# Patient Record
Sex: Male | Born: 1965 | Race: Black or African American | Marital: Married | Smoking: Former smoker
Health system: Southern US, Community
[De-identification: ages and names within clinical notes are randomized; demographics above are authoritative.]

## PROBLEM LIST (undated history)

## (undated) DIAGNOSIS — E785 Hyperlipidemia, unspecified: Secondary | ICD-10-CM

## (undated) DIAGNOSIS — F4310 Post-traumatic stress disorder, unspecified: Secondary | ICD-10-CM

## (undated) DIAGNOSIS — R011 Cardiac murmur, unspecified: Secondary | ICD-10-CM

## (undated) DIAGNOSIS — T7840XA Allergy, unspecified, initial encounter: Secondary | ICD-10-CM

## (undated) DIAGNOSIS — M549 Dorsalgia, unspecified: Secondary | ICD-10-CM

## (undated) DIAGNOSIS — M1990 Unspecified osteoarthritis, unspecified site: Secondary | ICD-10-CM

## (undated) DIAGNOSIS — M4800 Spinal stenosis, site unspecified: Secondary | ICD-10-CM

## (undated) HISTORY — DX: Allergy, unspecified, initial encounter: T78.40XA

## (undated) HISTORY — DX: Hyperlipidemia, unspecified: E78.5

## (undated) HISTORY — DX: Unspecified osteoarthritis, unspecified site: M19.90

## (undated) HISTORY — PX: WRIST SURGERY: SHX841

---

## 1994-11-14 HISTORY — PX: TENDON REPAIR: SHX5111

## 2013-06-18 ENCOUNTER — Other Ambulatory Visit: Payer: Self-pay | Admitting: Physical Medicine and Rehabilitation

## 2013-06-18 DIAGNOSIS — IMO0002 Reserved for concepts with insufficient information to code with codable children: Secondary | ICD-10-CM

## 2013-06-18 DIAGNOSIS — M47817 Spondylosis without myelopathy or radiculopathy, lumbosacral region: Secondary | ICD-10-CM

## 2013-06-18 DIAGNOSIS — M48062 Spinal stenosis, lumbar region with neurogenic claudication: Secondary | ICD-10-CM

## 2013-06-21 ENCOUNTER — Ambulatory Visit
Admission: RE | Admit: 2013-06-21 | Discharge: 2013-06-21 | Disposition: A | Discharge status: Home / Self Care | Payer: BC Managed Care – PPO | Source: Ambulatory Visit | Attending: Physical Medicine and Rehabilitation | Admitting: Physical Medicine and Rehabilitation

## 2013-06-21 ENCOUNTER — Other Ambulatory Visit: Payer: Self-pay | Admitting: Physical Medicine and Rehabilitation

## 2013-06-21 DIAGNOSIS — G8929 Other chronic pain: Secondary | ICD-10-CM

## 2013-06-25 ENCOUNTER — Other Ambulatory Visit: Payer: Self-pay | Admitting: Physical Medicine and Rehabilitation

## 2013-06-25 DIAGNOSIS — IMO0002 Reserved for concepts with insufficient information to code with codable children: Secondary | ICD-10-CM

## 2013-06-25 DIAGNOSIS — M47817 Spondylosis without myelopathy or radiculopathy, lumbosacral region: Secondary | ICD-10-CM

## 2013-06-25 DIAGNOSIS — M48062 Spinal stenosis, lumbar region with neurogenic claudication: Secondary | ICD-10-CM

## 2013-06-28 ENCOUNTER — Other Ambulatory Visit: Payer: BC Managed Care – PPO

## 2013-07-01 ENCOUNTER — Ambulatory Visit
Admission: RE | Admit: 2013-07-01 | Discharge: 2013-07-01 | Disposition: A | Discharge status: Home / Self Care | Payer: BC Managed Care – PPO | Source: Ambulatory Visit | Attending: Physical Medicine and Rehabilitation | Admitting: Physical Medicine and Rehabilitation

## 2013-07-01 DIAGNOSIS — M47817 Spondylosis without myelopathy or radiculopathy, lumbosacral region: Secondary | ICD-10-CM

## 2013-07-01 DIAGNOSIS — IMO0002 Reserved for concepts with insufficient information to code with codable children: Secondary | ICD-10-CM

## 2013-07-01 DIAGNOSIS — M48062 Spinal stenosis, lumbar region with neurogenic claudication: Secondary | ICD-10-CM

## 2013-07-04 ENCOUNTER — Other Ambulatory Visit: Payer: Self-pay | Admitting: Physical Medicine and Rehabilitation

## 2013-07-04 DIAGNOSIS — M25561 Pain in right knee: Secondary | ICD-10-CM

## 2013-07-18 ENCOUNTER — Other Ambulatory Visit: Payer: BC Managed Care – PPO

## 2013-07-19 ENCOUNTER — Ambulatory Visit
Admission: RE | Admit: 2013-07-19 | Discharge: 2013-07-19 | Disposition: A | Discharge status: Home / Self Care | Payer: BC Managed Care – PPO | Source: Ambulatory Visit | Attending: Physical Medicine and Rehabilitation | Admitting: Physical Medicine and Rehabilitation

## 2013-07-19 DIAGNOSIS — M25561 Pain in right knee: Secondary | ICD-10-CM

## 2015-10-26 ENCOUNTER — Emergency Department (HOSPITAL_COMMUNITY)
Admission: EM | Admit: 2015-10-26 | Discharge: 2015-10-26 | Disposition: A | Discharge status: Home / Self Care | Payer: BLUE CROSS/BLUE SHIELD | Attending: Emergency Medicine | Admitting: Emergency Medicine

## 2015-10-26 ENCOUNTER — Emergency Department (HOSPITAL_COMMUNITY): Payer: BLUE CROSS/BLUE SHIELD

## 2015-10-26 ENCOUNTER — Encounter (HOSPITAL_COMMUNITY): Payer: Self-pay | Admitting: Emergency Medicine

## 2015-10-26 DIAGNOSIS — S2241XA Multiple fractures of ribs, right side, initial encounter for closed fracture: Secondary | ICD-10-CM | POA: Insufficient documentation

## 2015-10-26 DIAGNOSIS — R200 Anesthesia of skin: Secondary | ICD-10-CM | POA: Insufficient documentation

## 2015-10-26 DIAGNOSIS — S199XXA Unspecified injury of neck, initial encounter: Secondary | ICD-10-CM | POA: Insufficient documentation

## 2015-10-26 DIAGNOSIS — S299XXA Unspecified injury of thorax, initial encounter: Secondary | ICD-10-CM | POA: Insufficient documentation

## 2015-10-26 DIAGNOSIS — S2231XA Fracture of one rib, right side, initial encounter for closed fracture: Secondary | ICD-10-CM

## 2015-10-26 DIAGNOSIS — S79912A Unspecified injury of left hip, initial encounter: Secondary | ICD-10-CM | POA: Insufficient documentation

## 2015-10-26 DIAGNOSIS — Y999 Unspecified external cause status: Secondary | ICD-10-CM | POA: Diagnosis not present

## 2015-10-26 DIAGNOSIS — S0990XA Unspecified injury of head, initial encounter: Secondary | ICD-10-CM | POA: Diagnosis not present

## 2015-10-26 DIAGNOSIS — Z23 Encounter for immunization: Secondary | ICD-10-CM | POA: Insufficient documentation

## 2015-10-26 DIAGNOSIS — Y92410 Unspecified street and highway as the place of occurrence of the external cause: Secondary | ICD-10-CM | POA: Diagnosis not present

## 2015-10-26 DIAGNOSIS — Y9389 Activity, other specified: Secondary | ICD-10-CM | POA: Diagnosis not present

## 2015-10-26 DIAGNOSIS — S3992XA Unspecified injury of lower back, initial encounter: Secondary | ICD-10-CM | POA: Diagnosis present

## 2015-10-26 LAB — URINALYSIS, ROUTINE W REFLEX MICROSCOPIC
Bilirubin Urine: NEGATIVE
GLUCOSE, UA: NEGATIVE mg/dL
HGB URINE DIPSTICK: NEGATIVE
KETONES UR: 15 mg/dL — AB
LEUKOCYTES UA: NEGATIVE
Nitrite: NEGATIVE
PROTEIN: NEGATIVE mg/dL
Specific Gravity, Urine: 1.023 (ref 1.005–1.030)
pH: 6.5 (ref 5.0–8.0)

## 2015-10-26 MED ORDER — HYDROMORPHONE HCL 1 MG/ML IJ SOLN
2.0000 mg | Freq: Once | INTRAMUSCULAR | Status: AC
  Administered 2015-10-26: 2 mg via INTRAMUSCULAR
  Filled 2015-10-26: qty 2

## 2015-10-26 MED ORDER — OXYCODONE-ACETAMINOPHEN 5-325 MG PO TABS: 1.0000 | ORAL_TABLET | ORAL | Status: DC | PRN

## 2015-10-26 MED ORDER — TETANUS-DIPHTH-ACELL PERTUSSIS 5-2.5-18.5 LF-MCG/0.5 IM SUSP
0.5000 mL | Freq: Once | INTRAMUSCULAR | Status: AC
  Administered 2015-10-26: 0.5 mL via INTRAMUSCULAR
  Filled 2015-10-26: qty 0.5

## 2015-10-26 MED ORDER — ONDANSETRON 4 MG PO TBDP
8.0000 mg | ORAL_TABLET | Freq: Once | ORAL | Status: AC
  Administered 2015-10-26: 8 mg via ORAL
  Filled 2015-10-26: qty 2

## 2015-10-26 MED ORDER — DIAZEPAM 5 MG/ML IJ SOLN
10.0000 mg | Freq: Once | INTRAMUSCULAR | Status: AC
  Administered 2015-10-26: 10 mg via INTRAMUSCULAR
  Filled 2015-10-26: qty 2

## 2015-10-26 MED ORDER — OXYCODONE-ACETAMINOPHEN 5-325 MG PO TABS
1.0000 | ORAL_TABLET | Freq: Once | ORAL | Status: AC
  Administered 2015-10-26: 1 via ORAL
  Filled 2015-10-26: qty 1
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
## 2015-10-26 NOTE — ED Notes (Signed)
Dr Effie ShyWentz at bedside.  Removed from LSB.  C-collar still in place. Placed pt in gown with blankets.

## 2015-10-26 NOTE — ED Notes (Signed)
Pt attempted to ambulate again.  Took several unsteady steps and returned to the stretcher due to severe pain radiating from middle right back down to right thigh.

## 2015-10-26 NOTE — ED Notes (Signed)
Pt sat up in bed stood on right foot and fell back into bed. Pt stated he feels as though he is having back spasms. Was unable to ambulated in hall. Nurse was notified.
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
## 2015-10-26 NOTE — ED Notes (Signed)
Pt in radiology 

## 2015-10-26 NOTE — ED Notes (Signed)
Patient transported to X-ray 

## 2015-10-27 ENCOUNTER — Encounter: Payer: Self-pay | Admitting: Primary Care

## 2015-10-27 ENCOUNTER — Ambulatory Visit (INDEPENDENT_AMBULATORY_CARE_PROVIDER_SITE_OTHER): Payer: BLUE CROSS/BLUE SHIELD | Admitting: Primary Care

## 2015-10-27 DIAGNOSIS — S2231XS Fracture of one rib, right side, sequela: Secondary | ICD-10-CM

## 2015-10-27 DIAGNOSIS — M545 Low back pain: Secondary | ICD-10-CM

## 2015-10-27 MED ORDER — HYDROCODONE-ACETAMINOPHEN 5-325 MG PO TABS: 1.0000 | ORAL_TABLET | Freq: Four times a day (QID) | ORAL | Status: DC | PRN
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
## 2015-10-27 NOTE — Progress Notes (Signed)
Pre visit review using our clinic review tool, if applicable. No additional management support is needed unless otherwise documented below in the visit note. 

## 2015-10-27 NOTE — Patient Instructions (Signed)
Stop by the front and speak with Shirlee LimerickMarion regarding your referral to orthopedics.  You may take the hydrocodone every 6 hours as needed for moderate to severe pain. Do not take this medication on an empty stomach. This medication may cause drowsiness.   Please schedule a physical with me within the next 3 months. You may also schedule a lab only appointment 3-4 days prior. We will discuss your lab results in detail during your physical.  It was a pleasure to meet you today! Please don't hesitate to call me with any questions. Welcome to [facility]!

## 2015-10-29 ENCOUNTER — Telehealth: Payer: Self-pay
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
## 2015-10-29 NOTE — Telephone Encounter (Signed)
Message left for Walter Beck to return my call.
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
## 2015-10-30 ENCOUNTER — Other Ambulatory Visit: Payer: Self-pay | Admitting: Orthopedic Surgery

## 2015-10-30 DIAGNOSIS — S2241XS Multiple fractures of ribs, right side, sequela: Secondary | ICD-10-CM

## 2015-10-30 NOTE — Telephone Encounter (Signed)
Message left for patient to return my call.  

## 2015-11-02 NOTE — Telephone Encounter (Signed)
Message left for Sharona from [employer] to return my call.

## 2015-11-04 ENCOUNTER — Ambulatory Visit
Admission: RE | Admit: 2015-11-04 | Discharge: 2015-11-04 | Disposition: A | Discharge status: Home / Self Care | Payer: BLUE CROSS/BLUE SHIELD | Source: Ambulatory Visit | Attending: Orthopedic Surgery | Admitting: Orthopedic Surgery

## 2015-11-04 DIAGNOSIS — S2241XS Multiple fractures of ribs, right side, sequela: Secondary | ICD-10-CM

## 2015-11-04 MED ORDER — IOPAMIDOL (ISOVUE-300) INJECTION 61%
125.0000 mL | Freq: Once | INTRAVENOUS | Status: AC | PRN
  Administered 2015-11-04: 125 mL via INTRAVENOUS

## 2015-11-10 ENCOUNTER — Other Ambulatory Visit: Payer: Self-pay

## 2015-11-10 DIAGNOSIS — S2231XS Fracture of one rib, right side, sequela: Secondary | ICD-10-CM

## 2015-11-10 MED ORDER — HYDROCODONE-ACETAMINOPHEN 5-325 MG PO TABS: 1.0000 | ORAL_TABLET | Freq: Four times a day (QID) | ORAL | Status: DC | PRN

## 2015-11-10 NOTE — Telephone Encounter (Signed)
Pt left v/m requesting rx hydrocodone apap. Call when ready for pick up.pt was last seen and rx last printed # 30 on 10/27/15. Pt states taking med q6h for pain; pain level averaging 7.

## 2015-11-10 NOTE — Telephone Encounter (Signed)
Spoke with patient and advised rx ready for pick-up and it will be at the front desk.   Also asked if pt has follow-up with ortho and he stated yes, and didn't think to ask them for a prescription. I advised that the next prescription will have to come from ortho and that we can not refill again.

## 2015-12-10 ENCOUNTER — Telehealth: Payer: Self-pay | Admitting: Primary Care

## 2015-12-10 ENCOUNTER — Ambulatory Visit (INDEPENDENT_AMBULATORY_CARE_PROVIDER_SITE_OTHER): Payer: BLUE CROSS/BLUE SHIELD | Admitting: Primary Care

## 2015-12-10 VITALS — BP 122/80 | HR 57 | Temp 98.1°F | Wt 215.1 lb

## 2015-12-10 DIAGNOSIS — F4310 Post-traumatic stress disorder, unspecified: Secondary | ICD-10-CM

## 2015-12-10 NOTE — Telephone Encounter (Signed)
FYI: pt was offered to see Salomon Fick today, 12/10/15 at 4pm but pt declined appt. He wanted to wait 2 weeks. He is scheduled to see Dr. Laymond Purser on 12/29/15 @ 130pm
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
## 2015-12-10 NOTE — Telephone Encounter (Signed)
Noted. Thanks responding so quickly.

## 2015-12-10 NOTE — Patient Instructions (Signed)
Stop by the front desk and speak with either Shirlee Limerick or Revonda Standard regarding your referral to therapy.   Please schedule a physical with me this summer or fall. You may also schedule a lab only appointment 3-4 days prior. We will discuss your lab results in detail during your physical.  Please don't hesitate to call me with any questions or concerns.  It was a pleasure to see you today!

## 2015-12-10 NOTE — Progress Notes (Signed)
Pre visit review using our clinic review tool, if applicable. No additional management support is needed unless otherwise documented below in the visit note. 

## 2015-12-29 ENCOUNTER — Ambulatory Visit (INDEPENDENT_AMBULATORY_CARE_PROVIDER_SITE_OTHER): Payer: PRIVATE HEALTH INSURANCE | Admitting: Psychology

## 2015-12-29 DIAGNOSIS — F4311 Post-traumatic stress disorder, acute: Secondary | ICD-10-CM | POA: Diagnosis not present

## 2015-12-31 ENCOUNTER — Telehealth: Payer: Self-pay

## 2015-12-31 NOTE — Telephone Encounter (Signed)
Bevelyn Ngo case mgr left v/m requesting confirmation of diagnosis for pt. Left v/m requesting cb.

## 2016-01-13 NOTE — Telephone Encounter (Signed)
Left detailed v/m for Sharona to cb if needed.

## 2016-01-19 ENCOUNTER — Ambulatory Visit: Payer: BLUE CROSS/BLUE SHIELD | Admitting: Psychology

## 2016-01-22 ENCOUNTER — Other Ambulatory Visit: Payer: Self-pay | Admitting: Surgical

## 2016-01-22 NOTE — Patient Instructions (Addendum)
YOUR PROCEDURE IS SCHEDULED ON : 02/02/16  REPORT TO [facility] MAIN ENTRANCE FOLLOW SIGNS TO EAST ELEVATOR - GO TO 3rd FLOOR CHECK IN AT 3 EAST NURSES STATION (SHORT STAY) AT:  10:30 AM  CALL THIS NUMBER IF YOU HAVE PROBLEMS THE MORNING OF SURGERY [phone]  REMEMBER:ONLY 1 PER PERSON MAY GO TO SHORT STAY WITH YOU TO GET READY THE MORNING OF YOUR SURGERY  DO NOT EAT FOOD OR DRINK LIQUIDS AFTER MIDNIGHT  TAKE THESE MEDICINES THE MORNING OF SURGERY: HYDROCODONE IF NEEDED  YOU MAY NOT HAVE ANY METAL ON YOUR BODY INCLUDING HAIR PINS AND PIERCING'S. DO NOT WEAR JEWELRY, MAKEUP, LOTIONS, POWDERS OR PERFUMES. DO NOT WEAR NAIL POLISH. DO NOT SHAVE 48 HRS PRIOR TO SURGERY. MEN MAY SHAVE FACE AND NECK.  DO NOT BRING VALUABLES TO HOSPITAL. [facility] IS NOT RESPONSIBLE FOR VALUABLES.  CONTACTS, DENTURES OR PARTIALS MAY NOT BE WORN TO SURGERY. LEAVE SUITCASE IN CAR. CAN BE BROUGHT TO ROOM AFTER SURGERY.  PATIENTS DISCHARGED THE DAY OF SURGERY WILL NOT BE ALLOWED TO DRIVE HOME.  PLEASE READ OVER THE FOLLOWING INSTRUCTION SHEETS _________________________________________________________________________________                                          [place] - PREPARING FOR SURGERY  Before surgery, you can play an important role.  Because skin is not sterile, your skin needs to be as free of germs as possible.  You can reduce the number of germs on your skin by washing with CHG (chlorahexidine gluconate) soap before surgery.  CHG is an antiseptic cleaner which kills germs and bonds with the skin to continue killing germs even after washing. Please DO NOT use if you have an allergy to CHG or antibacterial soaps.  If your skin becomes reddened/irritated stop using the CHG and inform your nurse when you arrive at Short Stay. Do not shave (including legs and underarms) for at least 48 hours prior to the first CHG shower.  You may shave your face. Please follow these  instructions carefully:   1.  Shower with CHG Soap the night before surgery and the  morning of Surgery.   2.  If you choose to wash your hair, wash your hair first as usual with your  normal  Shampoo.   3.  After you shampoo, rinse your hair and body thoroughly to remove the  shampoo.                                         4.  Use CHG as you would any other liquid soap.  You can apply chg directly  to the skin and wash . Gently wash with scrungie or clean wascloth    5.  Apply the CHG Soap to your body ONLY FROM THE NECK DOWN.   Do not use on open                           Wound or open sores. Avoid contact with eyes, ears mouth and genitals (private parts).                        Genitals (private parts) with your normal soap.  6.  Wash thoroughly, paying special attention to the area where your surgery  will be performed.   7.  Thoroughly rinse your body with warm water from the neck down.   8.  DO NOT shower/wash with your normal soap after using and rinsing off  the CHG Soap .                9.  Pat yourself dry with a clean towel.             10.  Wear clean night clothes to bed after shower             11.  Place clean sheets on your bed the night of your first shower and do not  sleep with pets.  Day of Surgery : Do not apply any lotions/deodorants the morning of surgery.  Please wear clean clothes to the hospital/surgery center.  FAILURE TO FOLLOW THESE INSTRUCTIONS MAY RESULT IN THE CANCELLATION OF YOUR SURGERY    PATIENT SIGNATURE_________________________________  ______________________________________________________________________     Adam Phenix  An incentive spirometer is a tool that can help keep your lungs clear and active. This tool measures how well you are filling your lungs with each breath. Taking long deep breaths may help reverse or decrease the chance of developing breathing (pulmonary) problems (especially infection)  following:  A long period of time when you are unable to move or be active. BEFORE THE PROCEDURE   If the spirometer includes an indicator to show your best effort, your nurse or respiratory therapist will set it to a desired goal.  If possible, sit up straight or lean slightly forward. Try not to slouch.  Hold the incentive spirometer in an upright position. INSTRUCTIONS FOR USE   Sit on the edge of your bed if possible, or sit up as far as you can in bed or on a chair.  Hold the incentive spirometer in an upright position.  Breathe out normally.  Place the mouthpiece in your mouth and seal your lips tightly around it.  Breathe in slowly and as deeply as possible, raising the piston or the ball toward the top of the column.  Hold your breath for 3-5 seconds or for as long as possible. Allow the piston or ball to fall to the bottom of the column.  Remove the mouthpiece from your mouth and breathe out normally.  Rest for a few seconds and repeat Steps 1 through 7 at least 10 times every 1-2 hours when you are awake. Take your time and take a few normal breaths between deep breaths.  The spirometer may include an indicator to show your best effort. Use the indicator as a goal to work toward during each repetition.  After each set of 10 deep breaths, practice coughing to be sure your lungs are clear. If you have an incision (the cut made at the time of surgery), support your incision when coughing by placing a pillow or rolled up towels firmly against it. Once you are able to get out of bed, walk around indoors and cough well. You may stop using the incentive spirometer when instructed by your caregiver.  RISKS AND COMPLICATIONS  Take your time so you do not get dizzy or light-headed.  If you are in pain, you may need to take or ask for pain medication before doing incentive spirometry. It is harder to take a deep breath if you are having pain. AFTER USE  Rest and breathe slowly  and  easily.  It can be helpful to keep track of a log of your progress. Your caregiver can provide you with a simple table to help with this. If you are using the spirometer at home, follow these instructions: [place] IF:   You are having difficultly using the spirometer.  You have trouble using the spirometer as often as instructed.  Your pain medication is not giving enough relief while using the spirometer.  You develop fever of 100.5 F (38.1 C) or higher. SEEK IMMEDIATE MEDICAL CARE IF:   You cough up bloody sputum that had not been present before.  You develop fever of 102 F (38.9 C) or greater.  You develop worsening pain at or near the incision site. MAKE SURE YOU:   Understand these instructions.  Will watch your condition.  Will get help right away if you are not doing well or get worse. Document Released: 03/13/2007 Document Revised: 01/23/2012 Document Reviewed: 05/14/2007 ExitCare Patient Information 2014 ExitCare, [place].   ________________________________________________________________________  WHAT IS A BLOOD TRANSFUSION? Blood Transfusion Information  A transfusion is the replacement of blood or some of its parts. Blood is made up of multiple cells which provide different functions.  Red blood cells carry oxygen and are used for blood loss replacement.  White blood cells fight against infection.  Platelets control bleeding.  Plasma helps clot blood.  Other blood products are available for specialized needs, such as hemophilia or other clotting disorders. BEFORE THE TRANSFUSION  Who gives blood for transfusions?   Healthy volunteers who are fully evaluated to make sure their blood is safe. This is blood bank blood. Transfusion therapy is the safest it has ever been in the practice of medicine. Before blood is taken from a donor, a complete history is taken to make sure that person has no history of diseases nor engages in risky social  behavior (examples are intravenous drug use or sexual activity with multiple partners). The donor's travel history is screened to minimize risk of transmitting infections, such as malaria. The donated blood is tested for signs of infectious diseases, such as HIV and hepatitis. The blood is then tested to be sure it is compatible with you in order to minimize the chance of a transfusion reaction. If you or a relative donates blood, this is often done in anticipation of surgery and is not appropriate for emergency situations. It takes many days to process the donated blood. RISKS AND COMPLICATIONS Although transfusion therapy is very safe and saves many lives, the main dangers of transfusion include:   Getting an infectious disease.  Developing a transfusion reaction. This is an allergic reaction to something in the blood you were given. Every precaution is taken to prevent this. The decision to have a blood transfusion has been considered carefully by your caregiver before blood is given. Blood is not given unless the benefits outweigh the risks. AFTER THE TRANSFUSION  Right after receiving a blood transfusion, you will usually feel much better and more energetic. This is especially true if your red blood cells have gotten low (anemic). The transfusion raises the level of the red blood cells which carry oxygen, and this usually causes an energy increase.  The nurse administering the transfusion will monitor you carefully for complications. HOME CARE INSTRUCTIONS  No special instructions are needed after a transfusion. You may find your energy is better. Speak with your caregiver about any limitations on activity for underlying diseases you may have. SEEK MEDICAL CARE IF:  Your condition is not improving after your transfusion.  You develop redness or irritation at the intravenous (IV) site. SEEK IMMEDIATE MEDICAL CARE IF:  Any of the following symptoms occur over the next 12 hours:  Shaking  chills.  You have a temperature by mouth above 102 F (38.9 C), not controlled by medicine.  Chest, back, or muscle pain.  People around you feel you are not acting correctly or are confused.  Shortness of breath or difficulty breathing.  Dizziness and fainting.  You get a rash or develop hives.  You have a decrease in urine output.  Your urine turns a dark color or changes to pink, red, or brown. Any of the following symptoms occur over the next 10 days:  You have a temperature by mouth above 102 F (38.9 C), not controlled by medicine.  Shortness of breath.  Weakness after normal activity.  The white part of the eye turns yellow (jaundice).  You have a decrease in the amount of urine or are urinating less often.  Your urine turns a dark color or changes to pink, red, or brown. Document Released: 10/28/2000 Document Revised: 01/23/2012 Document Reviewed: 06/16/2008 ExitCare Patient Information 2014 ExitCare, [place].  _______________________________________________________________________  MAY HAVE CLEAR LIQUIDS UNTIL 6:30 AM    CLEAR LIQUID DIET   Foods Allowed                                                                     Foods Excluded  Coffee and tea, regular and decaf                             liquids that you cannot  Plain Jell-O in any flavor                                             see through such as: Fruit ices (not with fruit pulp)                                     milk, soups, orange juice  Iced Popsicles                                                     All solid food Carbonated beverages, regular and diet                                    Cranberry, grape and apple juices Sports drinks like Gatorade Lightly seasoned clear broth or consume(fat free) Sugar, honey syrup  _____________________________________________________________________   

## 2016-01-26 ENCOUNTER — Encounter (HOSPITAL_COMMUNITY): Payer: Self-pay

## 2016-01-26 ENCOUNTER — Encounter (HOSPITAL_COMMUNITY)
Admission: RE | Admit: 2016-01-26 | Discharge: 2016-01-26 | Disposition: A | Discharge status: Home / Self Care | Payer: Worker's Compensation | Source: Ambulatory Visit | Attending: Orthopedic Surgery | Admitting: Orthopedic Surgery

## 2016-01-26 ENCOUNTER — Ambulatory Visit (HOSPITAL_COMMUNITY)
Admission: RE | Admit: 2016-01-26 | Discharge: 2016-01-26 | Disposition: A | Discharge status: Home / Self Care | Payer: Worker's Compensation | Source: Ambulatory Visit | Attending: Surgical | Admitting: Surgical

## 2016-01-26 DIAGNOSIS — Z01818 Encounter for other preprocedural examination: Secondary | ICD-10-CM | POA: Insufficient documentation

## 2016-01-26 HISTORY — DX: Cardiac murmur, unspecified: R01.1

## 2016-01-26 HISTORY — DX: Post-traumatic stress disorder, unspecified: F43.10

## 2016-01-26 HISTORY — DX: Dorsalgia, unspecified: M54.9

## 2016-01-26 HISTORY — DX: Spinal stenosis, site unspecified: M48.00

## 2016-01-26 LAB — CBC WITH DIFFERENTIAL/PLATELET
Basophils Absolute: 0 10*3/uL (ref 0.0–0.1)
Basophils Relative: 0 %
Eosinophils Absolute: 0.1 10*3/uL (ref 0.0–0.7)
Eosinophils Relative: 1 %
HCT: 46.8 % (ref 39.0–52.0)
Hemoglobin: 16.3 g/dL (ref 13.0–17.0)
Lymphocytes Relative: 27 %
Lymphs Abs: 2.3 10*3/uL (ref 0.7–4.0)
MCH: 30.8 pg (ref 26.0–34.0)
MCHC: 34.8 g/dL (ref 30.0–36.0)
MCV: 88.3 fL (ref 78.0–100.0)
Monocytes Absolute: 0.4 10*3/uL (ref 0.1–1.0)
Monocytes Relative: 5 %
Neutro Abs: 5.8 10*3/uL (ref 1.7–7.7)
Neutrophils Relative %: 67 %
Platelets: 213 10*3/uL (ref 150–400)
RBC: 5.3 MIL/uL (ref 4.22–5.81)
RDW: 13.5 % (ref 11.5–15.5)
WBC: 8.6 10*3/uL (ref 4.0–10.5)

## 2016-01-26 LAB — ABO/RH: ABO/RH(D): O POS

## 2016-01-26 LAB — PROTIME-INR
INR: 0.91 (ref 0.00–1.49)
Prothrombin Time: 12.5 seconds (ref 11.6–15.2)

## 2016-01-26 LAB — COMPREHENSIVE METABOLIC PANEL
ALT: 39 U/L (ref 17–63)
AST: 24 U/L (ref 15–41)
Albumin: 4.4 g/dL (ref 3.5–5.0)
Alkaline Phosphatase: 61 U/L (ref 38–126)
Anion gap: 10 (ref 5–15)
BUN: 13 mg/dL (ref 6–20)
CO2: 26 mmol/L (ref 22–32)
Calcium: 9.6 mg/dL (ref 8.9–10.3)
Chloride: 102 mmol/L (ref 101–111)
Creatinine, Ser: 0.9 mg/dL (ref 0.61–1.24)
GFR calc Af Amer: >60 mL/min (ref >60)
GFR calc non Af Amer: >60 mL/min (ref >60)
Glucose, Bld: 94 mg/dL (ref 65–99)
Potassium: 4.5 mmol/L (ref 3.5–5.1)
Sodium: 138 mmol/L (ref 135–145)
Total Bilirubin: 0.6 mg/dL (ref 0.3–1.2)
Total Protein: 7.7 g/dL (ref 6.5–8.1)

## 2016-01-26 LAB — SURGICAL PCR SCREEN
MRSA, PCR: NEGATIVE
Staphylococcus aureus: NEGATIVE

## 2016-01-26 LAB — APTT: aPTT: 30 seconds (ref 24–37)

## 2016-01-29 ENCOUNTER — Ambulatory Visit: Payer: BLUE CROSS/BLUE SHIELD | Admitting: Psychology

## 2016-02-02 ENCOUNTER — Observation Stay (HOSPITAL_COMMUNITY)
Admission: RE | Admit: 2016-02-02 | Discharge: 2016-02-03 | Disposition: A | Discharge status: Home / Self Care | Payer: Worker's Compensation | Source: Ambulatory Visit | Attending: Orthopedic Surgery | Admitting: Orthopedic Surgery

## 2016-02-02 ENCOUNTER — Ambulatory Visit (HOSPITAL_COMMUNITY): Payer: Worker's Compensation

## 2016-02-02 ENCOUNTER — Ambulatory Visit (HOSPITAL_COMMUNITY): Payer: Worker's Compensation | Admitting: Certified Registered"

## 2016-02-02 ENCOUNTER — Encounter (HOSPITAL_COMMUNITY)
Admission: RE | Disposition: A | Discharge status: Home / Self Care | Payer: Self-pay | Source: Ambulatory Visit | Attending: Orthopedic Surgery

## 2016-02-02 ENCOUNTER — Encounter (HOSPITAL_COMMUNITY): Payer: Self-pay | Admitting: *Deleted

## 2016-02-02 DIAGNOSIS — Z79899 Other long term (current) drug therapy: Secondary | ICD-10-CM | POA: Diagnosis not present

## 2016-02-02 DIAGNOSIS — M4807 Spinal stenosis, lumbosacral region: Principal | ICD-10-CM | POA: Insufficient documentation

## 2016-02-02 DIAGNOSIS — Z87891 Personal history of nicotine dependence: Secondary | ICD-10-CM | POA: Diagnosis not present

## 2016-02-02 DIAGNOSIS — M48061 Spinal stenosis, lumbar region without neurogenic claudication: Secondary | ICD-10-CM

## 2016-02-02 DIAGNOSIS — Z791 Long term (current) use of non-steroidal anti-inflammatories (NSAID): Secondary | ICD-10-CM | POA: Insufficient documentation

## 2016-02-02 DIAGNOSIS — M5126 Other intervertebral disc displacement, lumbar region: Secondary | ICD-10-CM | POA: Insufficient documentation

## 2016-02-02 DIAGNOSIS — Z7982 Long term (current) use of aspirin: Secondary | ICD-10-CM | POA: Diagnosis not present

## 2016-02-02 DIAGNOSIS — M48062 Spinal stenosis, lumbar region with neurogenic claudication: Secondary | ICD-10-CM | POA: Diagnosis present

## 2016-02-02 DIAGNOSIS — Z79891 Long term (current) use of opiate analgesic: Secondary | ICD-10-CM | POA: Diagnosis not present

## 2016-02-02 DIAGNOSIS — S2231XS Fracture of one rib, right side, sequela: Secondary | ICD-10-CM

## 2016-02-02 DIAGNOSIS — M79604 Pain in right leg: Secondary | ICD-10-CM | POA: Diagnosis present

## 2016-02-02 DIAGNOSIS — Z419 Encounter for procedure for purposes other than remedying health state, unspecified: Secondary | ICD-10-CM

## 2016-02-02 HISTORY — PX: LUMBAR LAMINECTOMY/DECOMPRESSION MICRODISCECTOMY: SHX5026

## 2016-02-02 LAB — TYPE AND SCREEN
ABO/RH(D): O POS
Antibody Screen: NEGATIVE

## 2016-02-02 SURGERY — LUMBAR LAMINECTOMY/DECOMPRESSION MICRODISCECTOMY 1 LEVEL
Anesthesia: General | Site: Back

## 2016-02-02 MED ORDER — CEFAZOLIN SODIUM 1-5 GM-% IV SOLN
1.0000 g | Freq: Three times a day (TID) | INTRAVENOUS | Status: DC
  Administered 2016-02-02 – 2016-02-03 (×2): 1 g via INTRAVENOUS
  Filled 2016-02-02 (×3): qty 50

## 2016-02-02 MED ORDER — CEFAZOLIN SODIUM-DEXTROSE 2-3 GM-% IV SOLR
2.0000 g | INTRAVENOUS | Status: AC
  Administered 2016-02-02: 2 g via INTRAVENOUS

## 2016-02-02 MED ORDER — BUPIVACAINE LIPOSOME 1.3 % IJ SUSP
INTRAMUSCULAR | Status: DC | PRN
  Administered 2016-02-02: 20 mL

## 2016-02-02 MED ORDER — FLEET ENEMA 7-19 GM/118ML RE ENEM: 1.0000 | ENEMA | Freq: Once | RECTAL | Status: DC | PRN

## 2016-02-02 MED ORDER — BUPIVACAINE-EPINEPHRINE (PF) 0.5% -1:200000 IJ SOLN
INTRAMUSCULAR | Status: AC
  Filled 2016-02-02: qty 30

## 2016-02-02 MED ORDER — METHOCARBAMOL 500 MG PO TABS: 500.0000 mg | ORAL_TABLET | Freq: Four times a day (QID) | ORAL | Status: DC | PRN

## 2016-02-02 MED ORDER — MIDAZOLAM HCL 2 MG/2ML IJ SOLN
INTRAMUSCULAR | Status: AC
  Filled 2016-02-02: qty 2

## 2016-02-02 MED ORDER — DEXAMETHASONE SODIUM PHOSPHATE 10 MG/ML IJ SOLN
INTRAMUSCULAR | Status: AC
  Filled 2016-02-02: qty 1

## 2016-02-02 MED ORDER — LIDOCAINE HCL (CARDIAC) 20 MG/ML IV SOLN
INTRAVENOUS | Status: DC | PRN
  Administered 2016-02-02: 100 mg via INTRAVENOUS

## 2016-02-02 MED ORDER — FENTANYL CITRATE (PF) 100 MCG/2ML IJ SOLN
INTRAMUSCULAR | Status: AC
  Filled 2016-02-02: qty 2

## 2016-02-02 MED ORDER — MIDAZOLAM HCL 5 MG/5ML IJ SOLN
INTRAMUSCULAR | Status: DC | PRN
  Administered 2016-02-02: 2 mg via INTRAVENOUS

## 2016-02-02 MED ORDER — BACITRACIN-NEOMYCIN-POLYMYXIN 400-5-5000 EX OINT
TOPICAL_OINTMENT | CUTANEOUS | Status: AC
  Filled 2016-02-02: qty 1

## 2016-02-02 MED ORDER — ONDANSETRON HCL 4 MG/2ML IJ SOLN: 4.0000 mg | Freq: Once | INTRAMUSCULAR | Status: DC | PRN

## 2016-02-02 MED ORDER — ONDANSETRON HCL 4 MG/2ML IJ SOLN
INTRAMUSCULAR | Status: AC
  Filled 2016-02-02: qty 4

## 2016-02-02 MED ORDER — THROMBIN 5000 UNITS EX SOLR
OROMUCOSAL | Status: DC | PRN
  Administered 2016-02-02: 10 mL via TOPICAL

## 2016-02-02 MED ORDER — SUGAMMADEX SODIUM 200 MG/2ML IV SOLN
INTRAVENOUS | Status: DC | PRN
  Administered 2016-02-02: 200 mg via INTRAVENOUS

## 2016-02-02 MED ORDER — HYDROMORPHONE HCL 1 MG/ML IJ SOLN: 0.5000 mg | INTRAMUSCULAR | Status: DC | PRN

## 2016-02-02 MED ORDER — BUPIVACAINE-EPINEPHRINE 0.5% -1:200000 IJ SOLN
INTRAMUSCULAR | Status: DC | PRN
  Administered 2016-02-02: 20 mL

## 2016-02-02 MED ORDER — DEXAMETHASONE SODIUM PHOSPHATE 10 MG/ML IJ SOLN
INTRAMUSCULAR | Status: DC | PRN
  Administered 2016-02-02: 10 mg via INTRAVENOUS

## 2016-02-02 MED ORDER — KETOROLAC TROMETHAMINE 30 MG/ML IJ SOLN
INTRAMUSCULAR | Status: AC
  Filled 2016-02-02: qty 1

## 2016-02-02 MED ORDER — MEPERIDINE HCL 50 MG/ML IJ SOLN: 6.2500 mg | INTRAMUSCULAR | Status: DC | PRN

## 2016-02-02 MED ORDER — FENTANYL CITRATE (PF) 100 MCG/2ML IJ SOLN
INTRAMUSCULAR | Status: DC | PRN
  Administered 2016-02-02 (×2): 50 ug via INTRAVENOUS
  Administered 2016-02-02 (×2): 100 ug via INTRAVENOUS

## 2016-02-02 MED ORDER — CEFAZOLIN SODIUM-DEXTROSE 2-3 GM-% IV SOLR
INTRAVENOUS | Status: AC
  Filled 2016-02-02: qty 50

## 2016-02-02 MED ORDER — PHENOL 1.4 % MT LIQD: 1.0000 | OROMUCOSAL | Status: DC | PRN

## 2016-02-02 MED ORDER — BUPIVACAINE LIPOSOME 1.3 % IJ SUSP
20.0000 mL | Freq: Once | INTRAMUSCULAR | Status: DC
  Filled 2016-02-02: qty 20

## 2016-02-02 MED ORDER — POLYETHYLENE GLYCOL 3350 17 G PO PACK: 17.0000 g | PACK | Freq: Every day | ORAL | Status: DC | PRN

## 2016-02-02 MED ORDER — LACTATED RINGERS IV SOLN
INTRAVENOUS | Status: DC
  Administered 2016-02-02 (×2): via INTRAVENOUS

## 2016-02-02 MED ORDER — OXYCODONE-ACETAMINOPHEN 5-325 MG PO TABS
1.0000 | ORAL_TABLET | Freq: Four times a day (QID) | ORAL | Status: DC | PRN
  Administered 2016-02-02 – 2016-02-03 (×2): 1 via ORAL
  Filled 2016-02-02 (×2): qty 1

## 2016-02-02 MED ORDER — HYDROMORPHONE HCL 1 MG/ML IJ SOLN
0.2500 mg | INTRAMUSCULAR | Status: DC | PRN
  Administered 2016-02-02 (×3): 0.5 mg via INTRAVENOUS

## 2016-02-02 MED ORDER — HYDROMORPHONE HCL 1 MG/ML IJ SOLN
INTRAMUSCULAR | Status: AC
  Filled 2016-02-02: qty 1

## 2016-02-02 MED ORDER — MENTHOL 3 MG MT LOZG: 1.0000 | LOZENGE | OROMUCOSAL | Status: DC | PRN

## 2016-02-02 MED ORDER — CHLORHEXIDINE GLUCONATE 4 % EX LIQD: 60.0000 mL | Freq: Once | CUTANEOUS | Status: DC

## 2016-02-02 MED ORDER — SUGAMMADEX SODIUM 200 MG/2ML IV SOLN
INTRAVENOUS | Status: AC
  Filled 2016-02-02: qty 2

## 2016-02-02 MED ORDER — SODIUM CHLORIDE 0.9 % IR SOLN
Status: DC | PRN
  Administered 2016-02-02: 500 mL

## 2016-02-02 MED ORDER — LACTATED RINGERS IV SOLN
INTRAVENOUS | Status: DC
  Administered 2016-02-02: 17:00:00 via INTRAVENOUS

## 2016-02-02 MED ORDER — THROMBIN 5000 UNITS EX SOLR
CUTANEOUS | Status: AC
  Filled 2016-02-02: qty 10000

## 2016-02-02 MED ORDER — ROCURONIUM BROMIDE 100 MG/10ML IV SOLN
INTRAVENOUS | Status: DC | PRN
  Administered 2016-02-02: 70 mg via INTRAVENOUS

## 2016-02-02 MED ORDER — ONDANSETRON HCL 4 MG/2ML IJ SOLN: 4.0000 mg | INTRAMUSCULAR | Status: DC | PRN

## 2016-02-02 MED ORDER — SODIUM CHLORIDE 0.9 % IR SOLN
Status: AC
  Filled 2016-02-02: qty 1

## 2016-02-02 MED ORDER — PROPOFOL 10 MG/ML IV BOLUS
INTRAVENOUS | Status: DC | PRN
  Administered 2016-02-02: 200 mg via INTRAVENOUS

## 2016-02-02 MED ORDER — LIDOCAINE HCL (CARDIAC) 20 MG/ML IV SOLN
INTRAVENOUS | Status: AC
Start: 2016-02-02 — End: 2016-02-02
  Filled 2016-02-02: qty 5

## 2016-02-02 MED ORDER — BISACODYL 5 MG PO TBEC: 5.0000 mg | DELAYED_RELEASE_TABLET | Freq: Every day | ORAL | Status: DC | PRN

## 2016-02-02 MED ORDER — METHOCARBAMOL 1000 MG/10ML IJ SOLN
500.0000 mg | Freq: Four times a day (QID) | INTRAVENOUS | Status: DC | PRN
  Administered 2016-02-02: 500 mg via INTRAVENOUS
  Filled 2016-02-02 (×2): qty 5

## 2016-02-02 MED ORDER — KETOROLAC TROMETHAMINE 30 MG/ML IJ SOLN
INTRAMUSCULAR | Status: DC | PRN
  Administered 2016-02-02: 30 mg via INTRAVENOUS

## 2016-02-02 MED ORDER — ACETAMINOPHEN 650 MG RE SUPP: 650.0000 mg | RECTAL | Status: DC | PRN

## 2016-02-02 MED ORDER — ONDANSETRON HCL 4 MG/2ML IJ SOLN
INTRAMUSCULAR | Status: DC | PRN
  Administered 2016-02-02 (×4): 2 mg via INTRAVENOUS

## 2016-02-02 MED ORDER — PROPOFOL 10 MG/ML IV BOLUS
INTRAVENOUS | Status: AC
  Filled 2016-02-02: qty 20

## 2016-02-02 MED ORDER — ACETAMINOPHEN 325 MG PO TABS: 650.0000 mg | ORAL_TABLET | ORAL | Status: DC | PRN

## 2016-02-02 SURGICAL SUPPLY — 48 items
CLEANER TIP ELECTROSURG 2X2 (MISCELLANEOUS) ×3 IMPLANT
CONT SPECI 4OZ STER CLIK (MISCELLANEOUS) ×3 IMPLANT
DRAPE MICROSCOPE LEICA (MISCELLANEOUS) ×3 IMPLANT
DRAPE POUCH INSTRU U-SHP 10X18 (DRAPES) ×3 IMPLANT
DRAPE SHEET LG 3/4 BI-LAMINATE (DRAPES) ×3 IMPLANT
DRAPE SURG 17X11 SM STRL (DRAPES) ×3 IMPLANT
DRSG ADAPTIC 3X8 NADH LF (GAUZE/BANDAGES/DRESSINGS) ×3 IMPLANT
DRSG PAD ABDOMINAL 8X10 ST (GAUZE/BANDAGES/DRESSINGS) ×12 IMPLANT
DURAPREP 26ML APPLICATOR (WOUND CARE) ×3 IMPLANT
ELECT BLADE TIP CTD 4 INCH (ELECTRODE) ×3 IMPLANT
ELECT REM PT RETURN 9FT ADLT (ELECTROSURGICAL) ×3
ELECTRODE REM PT RTRN 9FT ADLT (ELECTROSURGICAL) ×1 IMPLANT
GAUZE SPONGE 4X4 12PLY STRL (GAUZE/BANDAGES/DRESSINGS) ×3 IMPLANT
GLOVE BIO SURGEON STRL SZ 6.5 (GLOVE) ×2 IMPLANT
GLOVE BIO SURGEONS STRL SZ 6.5 (GLOVE) ×1
GLOVE BIOGEL PI IND STRL 7.0 (GLOVE) ×1 IMPLANT
GLOVE BIOGEL PI IND STRL 8 (GLOVE) ×1 IMPLANT
GLOVE BIOGEL PI INDICATOR 7.0 (GLOVE) ×2
GLOVE BIOGEL PI INDICATOR 8 (GLOVE) ×2
GLOVE ECLIPSE 8.0 STRL XLNG CF (GLOVE) ×3 IMPLANT
GLOVE INDICATOR 6.5 STRL GRN (GLOVE) ×3 IMPLANT
GLOVE SURG SS PI 7.0 STRL IVOR (GLOVE) ×3 IMPLANT
GOWN STRL REUS W/TWL LRG LVL3 (GOWN DISPOSABLE) ×6 IMPLANT
GOWN STRL REUS W/TWL XL LVL3 (GOWN DISPOSABLE) ×6 IMPLANT
KIT BASIN OR (CUSTOM PROCEDURE TRAY) ×3 IMPLANT
KIT POSITIONING SURG ANDREWS (MISCELLANEOUS) IMPLANT
MANIFOLD NEPTUNE II (INSTRUMENTS) ×3 IMPLANT
MARKER SKIN DUAL TIP RULER LAB (MISCELLANEOUS) ×3 IMPLANT
NEEDLE HYPO 22GX1.5 SAFETY (NEEDLE) ×3 IMPLANT
NEEDLE SPNL 18GX3.5 QUINCKE PK (NEEDLE) ×9 IMPLANT
PACK LAMINECTOMY ORTHO (CUSTOM PROCEDURE TRAY) ×3 IMPLANT
PAD ABD 8X10 STRL (GAUZE/BANDAGES/DRESSINGS) ×3 IMPLANT
PATTIES SURGICAL .5 X.5 (GAUZE/BANDAGES/DRESSINGS) IMPLANT
PATTIES SURGICAL .75X.75 (GAUZE/BANDAGES/DRESSINGS) ×3 IMPLANT
PATTIES SURGICAL 1X1 (DISPOSABLE) ×3 IMPLANT
RUBBERBAND STERILE (MISCELLANEOUS) ×3 IMPLANT
SPONGE LAP 4X18 X RAY DECT (DISPOSABLE) ×9 IMPLANT
SPONGE SURGIFOAM ABS GEL 100 (HEMOSTASIS) ×3 IMPLANT
STAPLER VISISTAT 35W (STAPLE) ×3 IMPLANT
SUT VIC AB 0 CT1 27 (SUTURE) ×2
SUT VIC AB 0 CT1 27XBRD ANTBC (SUTURE) ×1 IMPLANT
SUT VIC AB 1 CT1 27 (SUTURE) ×6
SUT VIC AB 1 CT1 27XBRD ANTBC (SUTURE) ×3 IMPLANT
SUT VIC AB 2-0 CT1 27 (SUTURE) ×2
SUT VIC AB 2-0 CT1 TAPERPNT 27 (SUTURE) ×1 IMPLANT
SYR 20CC LL (SYRINGE) ×6 IMPLANT
TAPE CLOTH SURG 4X10 WHT LF (GAUZE/BANDAGES/DRESSINGS) ×3 IMPLANT
TOWEL OR 17X26 10 PK STRL BLUE (TOWEL DISPOSABLE) ×6 IMPLANT

## 2016-02-02 NOTE — Interval H&P Note (Signed)
History and Physical Interval Note:  02/02/2016 12:26 PM  Walter Beck  has presented today for surgery, with the diagnosis of DISC RUPTURE IN THE LUMBER L5-S1   The various methods of treatment have been discussed with the patient and family. After consideration of risks, benefits and other options for treatment, the patient has consented to  Procedure(s): LUMBAR LAMINECTOMY/DECOMPRESSION MICRODISCECTOMY 1 LEVEL L5-S1 RIGHT  (N/A) as a surgical intervention .  The patient's history has been reviewed, patient examined, no change in status, stable for surgery.  I have reviewed the patient's chart and labs.  Questions were answered to the patient's satisfaction.     Rakeisha Nyce A

## 2016-02-02 NOTE — Anesthesia Preprocedure Evaluation (Signed)
Anesthesia Evaluation  Patient identified by MRN, date of birth, ID band Patient awake    Reviewed: Allergy & Precautions, NPO status , Patient's Chart, lab work & pertinent test results  Airway Mallampati: I  TM Distance: >3 FB Neck ROM: Full    Dental   Pulmonary former smoker,    Pulmonary exam normal       Cardiovascular Normal cardiovascular exam    Neuro/Psych    GI/Hepatic   Endo/Other    Renal/GU      Musculoskeletal   Abdominal   Peds  Hematology   Anesthesia Other Findings   Reproductive/Obstetrics                             Anesthesia Physical Anesthesia Plan  ASA: II  Anesthesia Plan: General   Post-op Pain Management:    Induction: Intravenous  Airway Management Planned: Oral ETT  Additional Equipment:   Intra-op Plan:   Post-operative Plan: Extubation in OR  Informed Consent: I have reviewed the patients History and Physical, chart, labs and discussed the procedure including the risks, benefits and alternatives for the proposed anesthesia with the patient or authorized representative who has indicated his/her understanding and acceptance.     Plan Discussed with: CRNA and Surgeon  Anesthesia Plan Comments:         Anesthesia Quick Evaluation  

## 2016-02-02 NOTE — Transfer of Care (Signed)
Immediate Anesthesia Transfer of Care Note  Patient: Walter SpurlingRichard Kanno  Procedure(s) Performed: Procedure(s): LUMBAR LAMINECTOMY/DECOMPRESSION MICRODISCECTOMY 1 LEVEL L5-S1 RIGHT  (N/A)  Patient Location: PACU  Anesthesia Type:General  Level of Consciousness:  sedated, patient cooperative and responds to stimulation  Airway & Oxygen Therapy:Patient Spontanous Breathing and Patient connected to face mask oxgen  Post-op Assessment:  Report given to PACU RN and Post -op Vital signs reviewed and stable  Post vital signs:  Reviewed and stable  Last Vitals:  Filed Vitals:   02/02/16 1027  BP: 126/81  Pulse: 63  Temp: 36.5 C  Resp: 16    Complications: No apparent anesthesia complications

## 2016-02-02 NOTE — H&P (Signed)
Walter Beck is an 50 y.o. male.   Chief Complaint: Pain and tingling in his Right Leg HPI: Progressive pain in his Right Leg.  Past Medical History  Diagnosis Date  . Heart murmur   . Spinal stenosis   . PTSD (post-traumatic stress disorder)   . Back pain     Past Surgical History  Procedure Laterality Date  . Tendon repair  1996    right forearm    History reviewed. No pertinent family history. Social History:  reports that he has quit smoking. He quit smokeless tobacco use about 13 years ago. He reports that he drinks alcohol. He reports that he does not use illicit drugs.  Allergies: No Known Allergies  Medications Prior to Admission  Medication Sig Dispense Refill  . aspirin 325 MG tablet Take 325 mg by mouth 2 (two) times daily.    . cyclobenzaprine (FLEXERIL) 10 MG tablet Take 10 mg by mouth at bedtime.    Marland Kitchen. HYDROcodone-acetaminophen (NORCO/VICODIN) 5-325 MG tablet Take 1-2 tablets by mouth every 6 (six) hours as needed for moderate pain. 60 tablet 0  . nabumetone (RELAFEN) 500 MG tablet Take 500 mg by mouth 2 (two) times daily.    Marland Kitchen. oxyCODONE-acetaminophen (PERCOCET/ROXICET) 5-325 MG tablet Take 1 tablet by mouth every 6 (six) hours as needed for moderate pain or severe pain.      No results found for this or any previous visit (from the past 48 hour(s)). X-ray Lumbar Spine Ap And Lateral  02/02/2016  CLINICAL DATA:  Preop EXAM: LUMBAR SPINE - 2-3 VIEW COMPARISON:  11/04/2015 FINDINGS: Anatomic alignment. No vertebral compression deformity. Mild narrowing of the L5-S1 disc with mild facet arthropathy. No definite acute fracture. Healing right twelfth rib fracture is noted. IMPRESSION: No acute bony pathology. Electronically Signed   By: Jolaine ClickArthur  Hoss M.D.   On: 02/02/2016 11:49    Review of Systems  Constitutional: Negative.   HENT: Negative.   Eyes: Negative.   Respiratory: Negative.   Cardiovascular: Negative.   Gastrointestinal: Negative.   Genitourinary:  Negative.   Musculoskeletal: Positive for back pain.  Skin: Negative.   Neurological: Positive for focal weakness.  Endo/Heme/Allergies: Negative.   Psychiatric/Behavioral: Negative.     Blood pressure 126/81, pulse 63, temperature 97.7 F (36.5 C), temperature source Oral, resp. rate 16, height 6\' 2"  (1.88 m), weight 97.523 kg (215 lb), SpO2 100 %. Physical Exam  Constitutional: He appears well-developed.  HENT:  Head: Normocephalic.  Eyes: Pupils are equal, round, and reactive to light.  Neck: Normal range of motion.  Cardiovascular: Normal rate.   Respiratory: Effort normal.  GI: Soft.  Musculoskeletal: He exhibits tenderness.  Neurological:  Weakness of his right foot Dorsiflexors.  Skin: Skin is warm.  Psychiatric: He has a normal mood and affect.     Assessment/Plan Lumbar laminectomy and Microdiscectomy at L-5-S-1 on the right  Jillianne Gamino A, MD 02/02/2016, 12:20 PM

## 2016-02-02 NOTE — Anesthesia Procedure Notes (Signed)
Procedure Name: Intubation Date/Time: 02/02/2016 1:34 PM Performed by: Army FossaPULLIAM, Samentha Perham DANE Pre-anesthesia Checklist: Patient identified, Emergency Drugs available, Suction available, Patient being monitored and Timeout performed Patient Re-evaluated:Patient Re-evaluated prior to inductionOxygen Delivery Method: Circle system utilized Preoxygenation: Pre-oxygenation with 100% oxygen Intubation Type: IV induction Ventilation: Mask ventilation without difficulty and Oral airway inserted - appropriate to patient size Laryngoscope Size: Mac and 4 Grade View: Grade II Tube size: 7.5 mm Number of attempts: 1 Airway Equipment and Method: Patient positioned with wedge pillow and Stylet Placement Confirmation: ETT inserted through vocal cords under direct vision,  positive ETCO2,  CO2 detector and breath sounds checked- equal and bilateral Secured at: 23 cm Tube secured with: Tape Dental Injury: Teeth and Oropharynx as per pre-operative assessment

## 2016-02-02 NOTE — Brief Op Note (Signed)
02/02/2016  2:16 PM  PATIENT:  Walter Beck  50 y.o. male  PRE-OPERATIVE DIAGNOSIS:  DISC RUPTURE IN THE LUMBER L5-S1 on the right and Lateral Recess Stenosis.   POST-OPERATIVE DIAGNOSIS:  DISC RUPTURE IN THE LUMBER L5-S1 on the Right and Lateral Recess Stenosis.  PROCEDURE:  Procedure(s): LUMBAR LAMINECTOMY/DECOMPRESSION MICRODISCECTOMY 1 LEVEL L5-S1 RIGHT  (N/A)  SURGEON:  Surgeon(s) and Role:    * Ranee Gosselinonald Thelma Lorenzetti, MD - Primary  PHYSICIAN ASSISTANT:Amber Fall Riveronstable PA   ASSISTANTS:Amber Yutanonstable PA  ANESTHESIA:   general  EBL:  Total I/O In: 1000 [I.V.:1000] Out: -   BLOOD ADMINISTERED:none  DRAINS: none   LOCAL MEDICATIONS USED:  MARCAINE 20cc of 0.50% with Epinephrine at start of the case and 20cc of Exparel at the end of the case.   SPECIMEN:  No Specimen  DISPOSITION OF SPECIMEN:  N/A  COUNTS:  YES  TOURNIQUET:  * No tourniquets in log *  DICTATION: .Other Dictation: Dictation Number [id number]  PLAN OF CARE: Admit for overnight observation  PATIENT DISPOSITION:  PACU - hemodynamically stable.   Delay start of Pharmacological VTE agent (>24hrs) due to surgical blood loss or risk of bleeding: yes

## 2016-02-02 NOTE — Anesthesia Postprocedure Evaluation (Signed)
Anesthesia Post Note  Patient: Walter Beck  Procedure(s) Performed: Procedure(s) (LRB): LUMBAR LAMINECTOMY/DECOMPRESSION MICRODISCECTOMY 1 LEVEL L5-S1 RIGHT  (N/A)  Patient location during evaluation: PACU Anesthesia Type: General Level of consciousness: awake and alert Pain management: pain level controlled Vital Signs Assessment: post-procedure vital signs reviewed and stable Respiratory status: spontaneous breathing, nonlabored ventilation, respiratory function stable and patient connected to nasal cannula oxygen Cardiovascular status: blood pressure returned to baseline and stable Postop Assessment: no signs of nausea or vomiting Anesthetic complications: no    Last Vitals:  Filed Vitals:   02/02/16 1540 02/02/16 1551  BP:  143/80  Pulse: 92 82  Temp: 36.6 C 36.6 C  Resp: 15 16    Last Pain:  Filed Vitals:   02/02/16 1554  PainSc: Asleep                 July Linam DAVID

## 2016-02-03 DIAGNOSIS — M4807 Spinal stenosis, lumbosacral region: Secondary | ICD-10-CM | POA: Diagnosis not present

## 2016-02-03 MED ORDER — METHOCARBAMOL 500 MG PO TABS: 500.0000 mg | ORAL_TABLET | Freq: Four times a day (QID) | ORAL | Status: DC | PRN

## 2016-02-03 MED ORDER — OXYCODONE-ACETAMINOPHEN 5-325 MG PO TABS: 1.0000 | ORAL_TABLET | Freq: Four times a day (QID) | ORAL | Status: DC | PRN

## 2016-02-03 MED ORDER — LACTATED RINGERS IV BOLUS (SEPSIS)
500.0000 mL | Freq: Once | INTRAVENOUS | Status: AC
  Administered 2016-02-03: 500 mL via INTRAVENOUS

## 2016-02-03 MED ORDER — HYDROCODONE-ACETAMINOPHEN 5-325 MG PO TABS: 1.0000 | ORAL_TABLET | ORAL | Status: DC | PRN

## 2016-02-03 NOTE — Discharge Instructions (Signed)
For the first two days, remove your dressing, tape a piece of saran wrap over your incision  Take your shower, then remove the saran wrap and put a clean dressing on. After two days you can shower without the saran wrap.  No driving while taking pain medications No lifting or twisting Call Dr. Darrelyn HillockGioffre if any wound complications or temperature of 101 degrees F or over.  Call the office for an appointment to see Dr. Darrelyn HillockGioffre in two weeks: [phone] and ask for Dr. Jeannetta EllisGioffre's nurse, Mackey Birchwoodammy Johnson.

## 2016-02-03 NOTE — Progress Notes (Deleted)
Subjective: 1 Day Post-Op Procedure(s) (LRB): LUMBAR LAMINECTOMY/DECOMPRESSION MICRODISCECTOMY 1 LEVEL L5-S1 RIGHT  (N/A) Patient reports pain as 2 on 0-10 scale.  Since March 15,she states that she is unable to move her [place]. When examining her she is not consistent in her Motor exam of her lowers. She has good sensation .Will order a MRI of her Thoracic Spine. MRI of Lumbar spine was not helpful. She had Spinal Surgery at [facility] years ago. She has had persistent Incontinence since that surgery.Will also have Neurology evaluate.  Objective: Vital signs in last 24 hours: Temp:  [97.6 F (36.4 C)-98.5 F (36.9 C)] 98 F (36.7 C) (03/22 0952) Pulse Rate:  [63-92] 70 (03/22 0952) Resp:  [12-23] 16 (03/22 0952) BP: (107-153)/(49-91) 143/83 mmHg (03/22 0952) SpO2:  [96 %-100 %] 100 % (03/22 0952) Weight:  [97.523 kg (215 lb)] 97.523 kg (215 lb) (03/21 1600)  Intake/Output from previous day: 03/21 0701 - 03/22 0700 In: 3990 [P.O.:960; I.V.:2975; IV Piggyback:55] Out: 1800 [Urine:1750; Blood:50] Intake/Output this shift: Total I/O In: 240 [P.O.:240] Out: -   No results for input(s): HGB in the last 72 hours. No results for input(s): WBC, RBC, HCT, PLT in the last 72 hours. No results for input(s): NA, K, CL, CO2, BUN, CREATININE, GLUCOSE, CALCIUM in the last 72 hours. No results for input(s): LABPT, INR in the last 72 hours.  Marked weakness of all muscle groups in her lowers.  Assessment/Plan: 1 Day Post-Op Procedure(s) (LRB): LUMBAR LAMINECTOMY/DECOMPRESSION MICRODISCECTOMY 1 LEVEL L5-S1 RIGHT  (N/A) Up with therapy  Prajwal Fellner A 02/03/2016, 10:01 AM

## 2016-02-03 NOTE — Progress Notes (Signed)
Occupational Therapy Evaluation Patient Details Name: Walter Beck MRN: [id number] DOB: Nov 02, 1966 Today's Date: 02/03/2016    History of Present Illness Pt is a 50 year old male s/p LUMBAR LAMINECTOMY/DECOMPRESSION MICRODISCECTOMY 1 LEVEL L5-S1 RIGHT    Clinical Impression   All OT education completed and patient questions answered. Patient to d/c home today with wife's assistance as needed. No further OT needs; will sign off.    Follow Up Recommendations  No OT follow up;Supervision/Assistance - 24 hour    Equipment Recommendations  3 in 1 bedside comode    Recommendations for Other Services       Precautions / Restrictions Precautions Precautions: Back Precaution Booklet Issued: Yes (comment) Precaution Comments: handout provided, went over handout Restrictions Weight Bearing Restrictions: No      Mobility Bed Mobility            General bed mobility comments: NT -- up in recliner  Transfers Overall transfer level: Needs assistance Equipment used: Rolling walker (2 wheeled) Transfers: Sit to/from Stand Sit to Stand: Supervision         General transfer comment: verbal cues for safe technique within precautions    Balance                                            ADL Overall ADL's : Needs assistance/impaired Eating/Feeding: Independent;Sitting   Grooming: Wash/dry hands;Supervision/safety;Standing   Upper Body Bathing: Set up;Sitting   Lower Body Bathing: Minimal assistance;Sit to/from stand   Upper Body Dressing : Set up;Sitting   Lower Body Dressing: Minimal assistance;Sit to/from stand   Toilet Transfer: Supervision/safety;Ambulation;BSC;RW   Toileting- Clothing Manipulation and Hygiene: Minimal assistance       Functional mobility during ADLs: Supervision/safety;Rolling walker General ADL Comments: Patient given handout on back precautions. Reviewed in detail. Up in recliner. Practiced ambulation to bathroom  with RW, toilet transfer. Will need 3 in 1 for home. Reviewed tub transfer technique with patient and he verbalized understanding. Declined to practice. Patient educated in LB dressing techniques.      Vision     Perception     Praxis      Pertinent Vitals/Pain Pain Assessment: 0-10 Pain Score: 5  Pain Location: back Pain Descriptors / Indicators: Sore Pain Intervention(s): Limited activity within patient's tolerance;Monitored during session;Repositioned     Hand Dominance     Extremity/Trunk Assessment Upper Extremity Assessment Upper Extremity Assessment: Overall WFL for tasks assessed   Lower Extremity Assessment Lower Extremity Assessment: Defer to PT evaluation        Communication Communication Communication: No difficulties   Cognition Arousal/Alertness: Awake/alert Behavior During Therapy: WFL for tasks assessed/performed Overall Cognitive Status: Within Functional Limits for tasks assessed                     General Comments       Exercises       Shoulder Instructions      Home Living Family/patient expects to be discharged to:: Private residence Living Arrangements: Spouse/significant other Available Help at Discharge: Family Type of Home: House Home Access: Level entry     Home Layout: One level     Bathroom Shower/Tub: Chief Strategy Officer: Standard Bathroom Accessibility: Yes How Accessible: Accessible via walker Home Equipment: Walker - standard          Prior Functioning/Environment Level of Independence: Independent  OT Diagnosis: Acute pain   OT Problem List: Decreased strength;Decreased knowledge of use of DME or AE;Decreased knowledge of precautions;Pain   OT Treatment/Interventions:      OT Goals(Current goals can be found in the care plan section) Acute Rehab OT Goals Patient Stated Goal: home today OT Goal Formulation: All assessment and education complete, DC therapy  OT  Frequency:     Barriers to D/C:            Co-evaluation              End of Session Equipment Utilized During Treatment: Rolling walker  Activity Tolerance: Patient tolerated treatment well Patient left: in chair;with call bell/phone within reach;with family/visitor present   Time: [id number] OT Time Calculation (min): 19 min Charges:  OT General Charges $OT Visit: 1 Procedure OT Evaluation $OT Eval Low Complexity: 1 Procedure G-Codes: OT G-codes **NOT FOR INPATIENT CLASS** Functional Assessment Tool Used: clinical judgment Functional Limitation: Self care Self Care Current Status (V4098(G8987): At least 20 percent but less than 40 percent impaired, limited or restricted Self Care Goal Status (J1914(G8988): At least 20 percent but less than 40 percent impaired, limited or restricted Self Care Discharge Status [phone](G8989): At least 20 percent but less than 40 percent impaired, limited or restricted  Walter Beck A 02/03/2016, 12:53 PM

## 2016-02-03 NOTE — Care Management Note (Signed)
Case Management Note  Patient Details  Name: Walter Beck MRN: [id number] Date of Birth: 04/16/1966  Subjective/Objective:                  LUMBAR LAMINECTOMY/DECOMPRESSION MICRODISCECTOMY 1 LEVEL L5-S1 RIGHT (N/A) Action/Plan: Discharge planning Expected Discharge Date:  02/03/16               Expected Discharge Plan:  Home/Self Care  In-House Referral:     Discharge planning Services  CM Consult  Post Acute Care Choice:    Choice offered to:  Patient  DME Arranged:  3-N-1, Walker rolling DME Agency:     HH Arranged:  NA HH Agency:     Status of Service:  Completed, signed off  Medicare Important Message Given:    Date Medicare IM Given:    Medicare IM give by:    Date Additional Medicare IM Given:    Additional Medicare Important Message give by:     If discussed at [place] of Stay Meetings, dates discussed:    Additional Comments: CM met with pt who gave Charmaine Downs as contact for [facility] for arrangement of a 3n1 and rolling walker.  CM called Charmaine Downs, [phone] who requests I fax information for pt to [phone].  CM faxed and received confirmation of receipt.  Charmaine Downs also requested I call local case manager for pt, Carlyon Shadow [phone] and CM left request for callback (with no identifiers per HIPAA).  Darlene called back and requested I fax same information Ulice Dash requested to [phone] to the [place].  CM faxed requested information and received confirmation of receipt.   Dellie Catholic, RN 02/03/2016, 12:01 PM

## 2016-02-03 NOTE — Progress Notes (Signed)
Subjective: 1 Day Post-Op Procedure(s) (LRB): LUMBAR LAMINECTOMY/DECOMPRESSION MICRODISCECTOMY 1 LEVEL L5-S1 RIGHT  (N/A) Patient reports pain as 1 on 0-10 scale.Doing very well. BP is fine. Good strength in lowers. Will Dc.    Objective: Vital signs in last 24 hours: Temp:  [97.6 F (36.4 C)-98.5 F (36.9 C)] 98 F (36.7 C) (03/22 0952) Pulse Rate:  [63-92] 70 (03/22 0952) Resp:  [12-23] 16 (03/22 0952) BP: (107-153)/(49-91) 143/83 mmHg (03/22 0952) SpO2:  [96 %-100 %] 100 % (03/22 0952) Weight:  [97.523 kg (215 lb)] 97.523 kg (215 lb) (03/21 1600)  Intake/Output from previous day: 03/21 0701 - 03/22 0700 In: 3990 [P.O.:960; I.V.:2975; IV Piggyback:55] Out: 1800 [Urine:1750; Blood:50] Intake/Output this shift: Total I/O In: 240 [P.O.:240] Out: -   No results for input(s): HGB in the last 72 hours. No results for input(s): WBC, RBC, HCT, PLT in the last 72 hours. No results for input(s): NA, K, CL, CO2, BUN, CREATININE, GLUCOSE, CALCIUM in the last 72 hours. No results for input(s): LABPT, INR in the last 72 hours.  Neurologically intact  Assessment/Plan: 1 Day Post-Op Procedure(s) (LRB): LUMBAR LAMINECTOMY/DECOMPRESSION MICRODISCECTOMY 1 LEVEL L5-S1 RIGHT  (N/A) Up with therapy Discharge home with home health  Tyyonna Soucy A 02/03/2016, 9:57 AM
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
## 2016-02-03 NOTE — Progress Notes (Signed)
[facility]    [facility] the [facility]: RW and [facility]mode  If patient discharges after hours, please call [phone].   Renard HamperLecretia Williamson 02/03/2016, 1:23 PM

## 2016-02-03 NOTE — Op Note (Signed)
NAMEMarland Kitchen  Walter, Beck NO.:  [id number]  MEDICAL RECORD NO.:  [id number]  LOCATION:  1617                         FACILITY:  [facility]  PHYSICIAN:  Georges Lynch. Latavius Capizzi, M.D.DATE OF BIRTH:  [birth year]  DATE OF PROCEDURE:  02/02/2016 DATE OF DISCHARGE:                              OPERATIVE REPORT   SURGEON:  Georges Lynch. Darrelyn Hillock, M.D.  OPERATIVE ASSISTANT:  Dimitri Ped, PA.  PREOPERATIVE DIAGNOSES: 1. Lateral recess stenosis, L5-S1, on the right. 2. Herniated lumbar disk, L5-S1, on the right.  POSTOPERATIVE DIAGNOSES: 1. Lateral recess stenosis, L5-S1, on the right. 2. Herniated lumbar disk, L5-S1, on the right.  OPERATION: 1. Central decompressive lumbar laminectomy for spinal stenosis, which     included a lateral recess decompression. 2. Microdiscectomy utilizing the microscope at L5-S1 on the right. 3. Foraminotomy for the S1 root on the right.  DESCRIPTION OF PROCEDURE:  Under general anesthesia, the patient was placed on a spinal frame.  A routine orthopedic prep and draping of the back was carried out.  We went through the appropriate time-out and also marked the appropriate right leg in the holding area.  He had 2 g of IV Ancef preop.  At this time, after the time-out, 2 needles were placed in the back for localization purposes.  X-ray was taken.  At this time, an incision then was made over the L5-S1 interspace.  The incision was extended proximally and distally.  I then went down and inserted the self-retaining retractors.  I separated the muscle from the lamina and spinous processes bilaterally.  I inserted the McCullough retractors, but before doing this, an x-ray was taken to verify our position. Following that, I then carried out a central decompressive lumbar laminectomy because of the extent of the disk.  I went down centrally and to the right did a decompression very nicely of the lateral recess. I cauterized the lateral recess veins as well.   The microscope was used. I first identified the S1 nerve root, traced it out into the foramen, and did a nice foraminotomy.  We then gently retracted the dura with a DuraCore retractor.  A needle was placed in the disk space and an x-ray was taken.  Once we knew we were at L5-S1, a cruciate incision was made in the disk space.  At this time, I utilized a nerve hook and the Epstein curettes to tease out the disk material and I went down in the space and completed the discectomy.  Note, the space was narrowed.  I then utilized the hockey-stick to go up proximally to make sure the foramen above was free and it was.  I thoroughly irrigated out the area and loosely applied some thrombin-soaked Gelfoam.  I then closed the muscle with #1 Vicryl, subcu was closed with 2-0 Vicryl, and the skin with metal staples.  Note, prior to surgery, I injected 20 mL of 0.5% Marcaine and epinephrine into the soft tissue to control bleeding.  At the end, I injected 20 mL of Exparel for pain relief.  Sterile dressings then were applied.  The patient left the operative room in satisfactory condition.          ______________________________ Georges Lynch Raygen Dahm,  M.D.     RAG/MEDQ  D:  02/02/2016  T:  02/03/2016  Job:  [id number]

## 2016-02-03 NOTE — Evaluation (Signed)
Physical Therapy One Time Evaluation Patient Details Name: Walter SpurlingRichard Beck MRN: [id number] DOB: 09-Jan-1966 Today's Date: 02/03/2016   History of Present Illness  Pt is a 50 year old male s/p LUMBAR LAMINECTOMY/DECOMPRESSION MICRODISCECTOMY 1 LEVEL L5-S1 RIGHT   Clinical Impression  Patient evaluated by Physical Therapy with no further acute PT needs identified. All education has been completed and the patient has no further questions.  Pt educated on back precautions and provided with handout.  Pt mobilizing well and feels ready for d/c home today. See below for any follow-up Physical Therapy or equipment needs. PT is signing off. Thank you for this referral.     Follow Up Recommendations No PT follow up    Equipment Recommendations  Rolling walker with 5" wheels    Recommendations for Other Services       Precautions / Restrictions Precautions Precautions: Back Restrictions Weight Bearing Restrictions: No      Mobility  Bed Mobility Overal bed mobility: Needs Assistance Bed Mobility: Supine to Sit     Supine to sit: Min assist     General bed mobility comments: verbal cues for log roll technique, assist for trunk upright  Transfers Overall transfer level: Needs assistance Equipment used: Rolling walker (2 wheeled) Transfers: Sit to/from Stand Sit to Stand: Min guard;Supervision         General transfer comment: verbal cues for safe technique within precautions  Ambulation/Gait Ambulation/Gait assistance: Min guard;Supervision Ambulation Distance (Feet): 380 Feet Assistive device: Rolling walker (2 wheeled) Gait Pattern/deviations: Step-through pattern;Trunk flexed     General Gait Details: verbal cues for RW distance and upright posture  Stairs            Wheelchair Mobility    Modified Rankin (Stroke Patients Only)       Balance                                             Pertinent Vitals/Pain Pain Assessment:  0-10 Pain Score: 2  Pain Location: back Pain Descriptors / Indicators: Sore Pain Intervention(s): Limited activity within patient's tolerance;Monitored during session;Repositioned    Home Living Family/patient expects to be discharged to:: Private residence Living Arrangements: Spouse/significant other     Home Access: Level entry     Home Layout: One level Home Equipment: Environmental consultantWalker - standard      Prior Function Level of Independence: Independent               Hand Dominance        Extremity/Trunk Assessment               Lower Extremity Assessment: Overall WFL for tasks assessed RLE Deficits / Details: reports some tingling in R LE presurgery however none at this time       Communication   Communication: No difficulties  Cognition Arousal/Alertness: Awake/alert Behavior During Therapy: WFL for tasks assessed/performed Overall Cognitive Status: Within Functional Limits for tasks assessed                      General Comments      Exercises        Assessment/Plan    PT Assessment Patent does not need any further PT services  PT Diagnosis Difficulty walking   PT Problem List    PT Treatment Interventions     PT Goals (Current goals can be found in  the Care Plan section) Acute Rehab PT Goals PT Goal Formulation: All assessment and education complete, DC therapy    Frequency     Barriers to discharge        Co-evaluation               End of Session   Activity Tolerance: Patient tolerated treatment well Patient left: in chair;with family/visitor present;with call bell/phone within reach Nurse Communication: Mobility status    Functional Assessment Tool Used: clinical judgement Functional Limitation: Mobility: Walking and moving around Mobility: Walking and Moving Around Current Status (U4403): At least 20 percent but less than 40 percent impaired, limited or restricted Mobility: Walking and Moving Around Goal Status  [phone]): At least 1 percent but less than 20 percent impaired, limited or restricted Mobility: Walking and Moving Around Discharge Status [phone]): At least 1 percent but less than 20 percent impaired, limited or restricted    Time: 0902-0921 PT Time Calculation (min) (ACUTE ONLY): 19 min   Charges:   PT Evaluation $PT Eval Low Complexity: 1 Procedure     PT G Codes:   PT G-Codes **NOT FOR INPATIENT CLASS** Functional Assessment Tool Used: clinical judgement Functional Limitation: Mobility: Walking and moving around Mobility: Walking and Moving Around Current Status (V5643): At least 20 percent but less than 40 percent impaired, limited or restricted Mobility: Walking and Moving Around Goal Status [phone]): At least 1 percent but less than 20 percent impaired, limited or restricted Mobility: Walking and Moving Around Discharge Status [phone]): At least 1 percent but less than 20 percent impaired, limited or restricted    Walter Beck,Walter Beck 02/03/2016, 11:14 AM Zenovia Jarred, PT, DPT 02/03/2016 Pager: [phone]

## 2016-03-02 ENCOUNTER — Ambulatory Visit: Payer: BLUE CROSS/BLUE SHIELD | Admitting: Psychology

## 2016-03-16 ENCOUNTER — Ambulatory Visit (INDEPENDENT_AMBULATORY_CARE_PROVIDER_SITE_OTHER): Payer: 59 | Admitting: Psychology

## 2016-03-16 DIAGNOSIS — F4323 Adjustment disorder with mixed anxiety and depressed mood: Secondary | ICD-10-CM | POA: Diagnosis not present

## 2016-03-17 DIAGNOSIS — M545 Low back pain: Secondary | ICD-10-CM | POA: Diagnosis not present

## 2016-03-21 DIAGNOSIS — M545 Low back pain: Secondary | ICD-10-CM | POA: Diagnosis not present

## 2016-03-24 DIAGNOSIS — M545 Low back pain: Secondary | ICD-10-CM | POA: Diagnosis not present

## 2016-03-30 ENCOUNTER — Ambulatory Visit (INDEPENDENT_AMBULATORY_CARE_PROVIDER_SITE_OTHER): Payer: 59 | Admitting: Psychology

## 2016-03-30 DIAGNOSIS — F4310 Post-traumatic stress disorder, unspecified: Secondary | ICD-10-CM

## 2016-04-04 DIAGNOSIS — M545 Low back pain: Secondary | ICD-10-CM | POA: Diagnosis not present

## 2016-04-08 DIAGNOSIS — M545 Low back pain: Secondary | ICD-10-CM | POA: Diagnosis not present

## 2016-04-12 DIAGNOSIS — M545 Low back pain: Secondary | ICD-10-CM | POA: Diagnosis not present

## 2016-04-15 ENCOUNTER — Ambulatory Visit (INDEPENDENT_AMBULATORY_CARE_PROVIDER_SITE_OTHER): Payer: 59 | Admitting: Psychology

## 2016-04-15 DIAGNOSIS — F4311 Post-traumatic stress disorder, acute: Secondary | ICD-10-CM

## 2016-04-19 DIAGNOSIS — M545 Low back pain: Secondary | ICD-10-CM | POA: Diagnosis not present

## 2016-04-21 DIAGNOSIS — S62502S Fracture of unspecified phalanx of left thumb, sequela: Secondary | ICD-10-CM | POA: Diagnosis not present

## 2016-04-21 DIAGNOSIS — M545 Low back pain: Secondary | ICD-10-CM | POA: Diagnosis not present

## 2016-04-22 ENCOUNTER — Other Ambulatory Visit: Payer: Self-pay | Admitting: Primary Care

## 2016-04-22 DIAGNOSIS — Z0000 Encounter for general adult medical examination without abnormal findings: Secondary | ICD-10-CM

## 2016-04-26 DIAGNOSIS — M545 Low back pain: Secondary | ICD-10-CM | POA: Diagnosis not present

## 2016-04-29 DIAGNOSIS — M545 Low back pain: Secondary | ICD-10-CM | POA: Diagnosis not present

## 2016-05-03 ENCOUNTER — Other Ambulatory Visit (INDEPENDENT_AMBULATORY_CARE_PROVIDER_SITE_OTHER): Payer: BLUE CROSS/BLUE SHIELD

## 2016-05-03 DIAGNOSIS — Z0000 Encounter for general adult medical examination without abnormal findings: Secondary | ICD-10-CM

## 2016-05-03 LAB — BASIC METABOLIC PANEL
BUN: 11 mg/dL (ref 6–23)
CALCIUM: 9.6 mg/dL (ref 8.4–10.5)
CHLORIDE: 103 meq/L (ref 96–112)
CO2: 29 meq/L (ref 19–32)
CREATININE: 0.88 mg/dL (ref 0.40–1.50)
GFR: 118.28 mL/min (ref >60.00)
Glucose, Bld: 97 mg/dL (ref 70–99)
Potassium: 4 mEq/L (ref 3.5–5.1)
SODIUM: 139 meq/L (ref 135–145)

## 2016-05-03 LAB — LIPID PANEL
CHOL/HDL RATIO: 6
CHOLESTEROL: 237 mg/dL — AB (ref 0–200)
HDL: 40.5 mg/dL (ref >39.00)
LDL Cholesterol: 173 mg/dL — ABNORMAL HIGH (ref 0–99)
NonHDL: 196.52
TRIGLYCERIDES: 120 mg/dL (ref 0.0–149.0)
VLDL: 24 mg/dL (ref 0.0–40.0)

## 2016-05-10 ENCOUNTER — Ambulatory Visit (INDEPENDENT_AMBULATORY_CARE_PROVIDER_SITE_OTHER): Payer: BLUE CROSS/BLUE SHIELD | Admitting: Primary Care

## 2016-05-10 ENCOUNTER — Encounter: Payer: Self-pay | Admitting: Primary Care

## 2016-05-10 VITALS — BP 122/80 | HR 75 | Temp 98.4°F | Ht 74.0 in | Wt 222.4 lb

## 2016-05-10 DIAGNOSIS — Z0000 Encounter for general adult medical examination without abnormal findings: Secondary | ICD-10-CM

## 2016-05-10 DIAGNOSIS — F4310 Post-traumatic stress disorder, unspecified: Secondary | ICD-10-CM

## 2016-05-10 DIAGNOSIS — E785 Hyperlipidemia, unspecified: Secondary | ICD-10-CM | POA: Insufficient documentation

## 2016-05-10 NOTE — Assessment & Plan Note (Signed)
Working with therapy currently and feels well managed. Overall doing much better.

## 2016-05-10 NOTE — Patient Instructions (Signed)
Start taking Aspirin 81 mg everyday to help protect your vessels from blockages.  Your cholesterol is slightly too high. Work to Countrywide Financialimprove your diet and exercise as tolerated to help reduce these levels.  Reduce bacon, red meat, potatoes, sodas.  Increase chicken, fish, vegetables, fruit, water.  Ensure you are consuming 64 ounces of water daily.  Schedule a lab only appointment in 6 months to recheck your cholesterol. Ensure you are fasting for 4 hours prior to your lab draw (water and black coffee only)  Follow up in 1 year for repeat physical or sooner if needed.  It was a pleasure to see you today!  High Cholesterol High cholesterol refers to having a high level of cholesterol in your blood. Cholesterol is a white, waxy, fat-like protein that your body needs in small amounts. Your liver makes all the cholesterol you need. Excess cholesterol comes from the food you eat. Cholesterol travels in your bloodstream through your blood vessels. If you have high cholesterol, deposits (plaque) may build up on the walls of your blood vessels. This makes the arteries narrower and stiffer. Plaque increases your risk of heart attack and stroke. Work with your health care provider to keep your cholesterol levels in a healthy range. RISK FACTORS Several things can make you more likely to have high cholesterol. These include:   Eating foods high in animal fat (saturated fat) or cholesterol.  Being overweight.  Not getting enough exercise.  Having a family history of high cholesterol. SIGNS AND SYMPTOMS High cholesterol does not cause symptoms. DIAGNOSIS  Your health care provider can do a blood test to check whether you have high cholesterol. If you are older than 20, your health care provider may check your cholesterol every 4-6 years. You may be checked more often if you already have high cholesterol or other risk factors for heart disease. The blood test for cholesterol measures the  following:  Bad cholesterol (LDL cholesterol). This is the type of cholesterol that causes heart disease. This number should be less than 100.  Good cholesterol (HDL cholesterol). This type helps protect against heart disease. A healthy level of HDL cholesterol is 60 or higher.  Total cholesterol. This is the combined number of LDL cholesterol and HDL cholesterol. A healthy number is less than 200. TREATMENT  High cholesterol can be treated with diet changes, lifestyle changes, and medicine.   Diet changes may include eating more whole grains, fruits, vegetables, nuts, and fish. You may also have to cut back on red meat and foods with a lot of added sugar.  Lifestyle changes may include getting at least 40 minutes of aerobic exercise three times a week. Aerobic exercises include walking, biking, and swimming. Aerobic exercise along with a healthy diet can help you maintain a healthy weight. Lifestyle changes may also include quitting smoking.  If diet and lifestyle changes are not enough to lower your cholesterol, your health care provider may prescribe a statin medicine. This medicine has been shown to lower cholesterol and also lower the risk of heart disease. HOME CARE INSTRUCTIONS  Only take over-the-counter or prescription medicines as directed by your health care provider.   Follow a healthy diet as directed by your health care provider. For instance:   Eat chicken (without skin), fish, veal, shellfish, ground [place]key breast, and round or loin cuts of red meat.  Do not eat fried foods and fatty meats, such as hot dogs and salami.   Eat plenty of fruits, such as apples.  Eat plenty of vegetables, such as broccoli, potatoes, and carrots.   Eat beans, peas, and lentils.   Eat grains, such as barley, rice, couscous, and bulgur wheat.   Eat pasta without cream sauces.   Use skim or nonfat milk and low-fat or nonfat yogurt and cheeses. Do not eat or drink whole milk, cream,  ice cream, egg yolks, and hard cheeses.   Do not eat stick margarine or tub margarines that contain trans fats (also called partially hydrogenated oils).   Do not eat cakes, cookies, crackers, or other baked goods that contain trans fats.   Do not eat saturated tropical oils, such as coconut and palm oil.   Exercise as directed by your health care provider. Increase your activity level with activities such as gardening or walking.   Keep all follow-up appointments.  SEEK MEDICAL CARE IF:  You are struggling to maintain a healthy diet or weight.  You need help starting an exercise program.  You need help to stop smoking. SEEK IMMEDIATE MEDICAL CARE IF:  You have chest pain.  You have trouble breathing.   This information is not intended to replace advice given to you by your health care provider. Make sure you discuss any questions you have with your health care provider.   Document Released: 10/31/2005 Document Revised: 11/21/2014 Document Reviewed: 08/23/2013 Elsevier Interactive Patient Education [employer]2016 Elsevier Inc.
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
## 2016-05-10 NOTE — Progress Notes (Signed)
Pre visit review using our clinic review tool, if applicable. No additional management support is needed unless otherwise documented below in the visit note. 

## 2016-05-10 NOTE — Assessment & Plan Note (Signed)
Tetanus up-to-date. Poor diet and does not exercise but is undergoing physical therapy from [facility] in December. Discussed the importance of a healthy diet and regular exercise in order for weight loss and to reduce risk of other medical diseases. Labs with hyperlipidemia, otherwise unremarkable. Exam unremarkable. Recommended annual eye exam and improvements in diet.  Repeat lipids in 3-6 months, follow-up in one year for repeat physical.

## 2016-05-10 NOTE — Assessment & Plan Note (Signed)
Poor diet and does not exercise. Total cholesterol and LDL slightly above goal. Discussed importance of improvements in diet to help lower these levels and prevent risks for further medical problems. Will allow him 3-6 months to work on cholesterol and add in statin treatment if no improvement.  Information provided regarding a low cholesterol diet.

## 2016-05-19 DIAGNOSIS — Z4789 Encounter for other orthopedic aftercare: Secondary | ICD-10-CM | POA: Diagnosis not present

## 2016-05-19 DIAGNOSIS — S335XXD Sprain of ligaments of lumbar spine, subsequent encounter: Secondary | ICD-10-CM | POA: Diagnosis not present

## 2016-05-28 DIAGNOSIS — M79642 Pain in left hand: Secondary | ICD-10-CM | POA: Diagnosis not present

## 2016-06-28 DIAGNOSIS — S63592A Other specified sprain of left wrist, initial encounter: Secondary | ICD-10-CM | POA: Diagnosis not present

## 2016-08-11 ENCOUNTER — Telehealth: Payer: Self-pay | Admitting: Primary Care

## 2016-08-11 NOTE — Telephone Encounter (Signed)
Spoke with pt he stated orthopedist already had paperwork.  He stated just file paperwork

## 2016-08-11 NOTE — Telephone Encounter (Signed)
Left message asking pt to call office Have questions about fmal/short term disabiltiy  Jae DireKate wanted to know if the paperwork needed to go to orthopedist?  Did they take him out of work

## 2016-08-17 ENCOUNTER — Ambulatory Visit (INDEPENDENT_AMBULATORY_CARE_PROVIDER_SITE_OTHER): Payer: 59 | Admitting: Psychology

## 2016-08-17 DIAGNOSIS — F4311 Post-traumatic stress disorder, acute: Secondary | ICD-10-CM

## 2016-08-18 DIAGNOSIS — S335XXD Sprain of ligaments of lumbar spine, subsequent encounter: Secondary | ICD-10-CM | POA: Diagnosis not present

## 2016-08-18 DIAGNOSIS — Z4789 Encounter for other orthopedic aftercare: Secondary | ICD-10-CM | POA: Diagnosis not present

## 2016-08-18 DIAGNOSIS — S63592D Other specified sprain of left wrist, subsequent encounter: Secondary | ICD-10-CM | POA: Diagnosis not present

## 2016-09-14 DIAGNOSIS — S63522A Sprain of radiocarpal joint of left wrist, initial encounter: Secondary | ICD-10-CM | POA: Diagnosis not present

## 2016-09-14 DIAGNOSIS — M19032 Primary osteoarthritis, left wrist: Secondary | ICD-10-CM | POA: Diagnosis not present

## 2016-09-14 DIAGNOSIS — M659 Synovitis and tenosynovitis, unspecified: Secondary | ICD-10-CM | POA: Diagnosis not present

## 2016-09-14 DIAGNOSIS — M24132 Other articular cartilage disorders, left wrist: Secondary | ICD-10-CM | POA: Diagnosis not present

## 2016-09-14 DIAGNOSIS — S63592A Other specified sprain of left wrist, initial encounter: Secondary | ICD-10-CM | POA: Diagnosis not present

## 2016-09-22 DIAGNOSIS — S335XXD Sprain of ligaments of lumbar spine, subsequent encounter: Secondary | ICD-10-CM | POA: Diagnosis not present

## 2016-09-22 DIAGNOSIS — Z4789 Encounter for other orthopedic aftercare: Secondary | ICD-10-CM | POA: Diagnosis not present

## 2016-09-22 DIAGNOSIS — S63592D Other specified sprain of left wrist, subsequent encounter: Secondary | ICD-10-CM | POA: Diagnosis not present

## 2016-10-24 ENCOUNTER — Other Ambulatory Visit: Payer: Self-pay | Admitting: Primary Care

## 2016-10-24 DIAGNOSIS — E785 Hyperlipidemia, unspecified: Secondary | ICD-10-CM

## 2016-10-31 ENCOUNTER — Other Ambulatory Visit (INDEPENDENT_AMBULATORY_CARE_PROVIDER_SITE_OTHER): Payer: BLUE CROSS/BLUE SHIELD

## 2016-10-31 DIAGNOSIS — E785 Hyperlipidemia, unspecified: Secondary | ICD-10-CM

## 2016-10-31 LAB — LIPID PANEL
CHOLESTEROL: 225 mg/dL — AB (ref 0–200)
HDL: 44.4 mg/dL (ref >39.00)
LDL CALC: 163 mg/dL — AB (ref 0–99)
NonHDL: 181.07
TRIGLYCERIDES: 91 mg/dL (ref 0.0–149.0)
Total CHOL/HDL Ratio: 5
VLDL: 18.2 mg/dL (ref 0.0–40.0)

## 2016-11-02 ENCOUNTER — Encounter: Payer: Self-pay | Admitting: *Deleted

## 2017-01-31 DIAGNOSIS — S335XXA Sprain of ligaments of lumbar spine, initial encounter: Secondary | ICD-10-CM | POA: Diagnosis not present

## 2017-01-31 DIAGNOSIS — S63592D Other specified sprain of left wrist, subsequent encounter: Secondary | ICD-10-CM | POA: Diagnosis not present

## 2017-02-10 IMAGING — DX DG CHEST 2V
2 series · 2 of 2 positions shown · non-contrast
Comparison: None.

CLINICAL DATA: Preop lumbar laminectomy.

EXAM:
CHEST  2 VIEW

[chest pa]
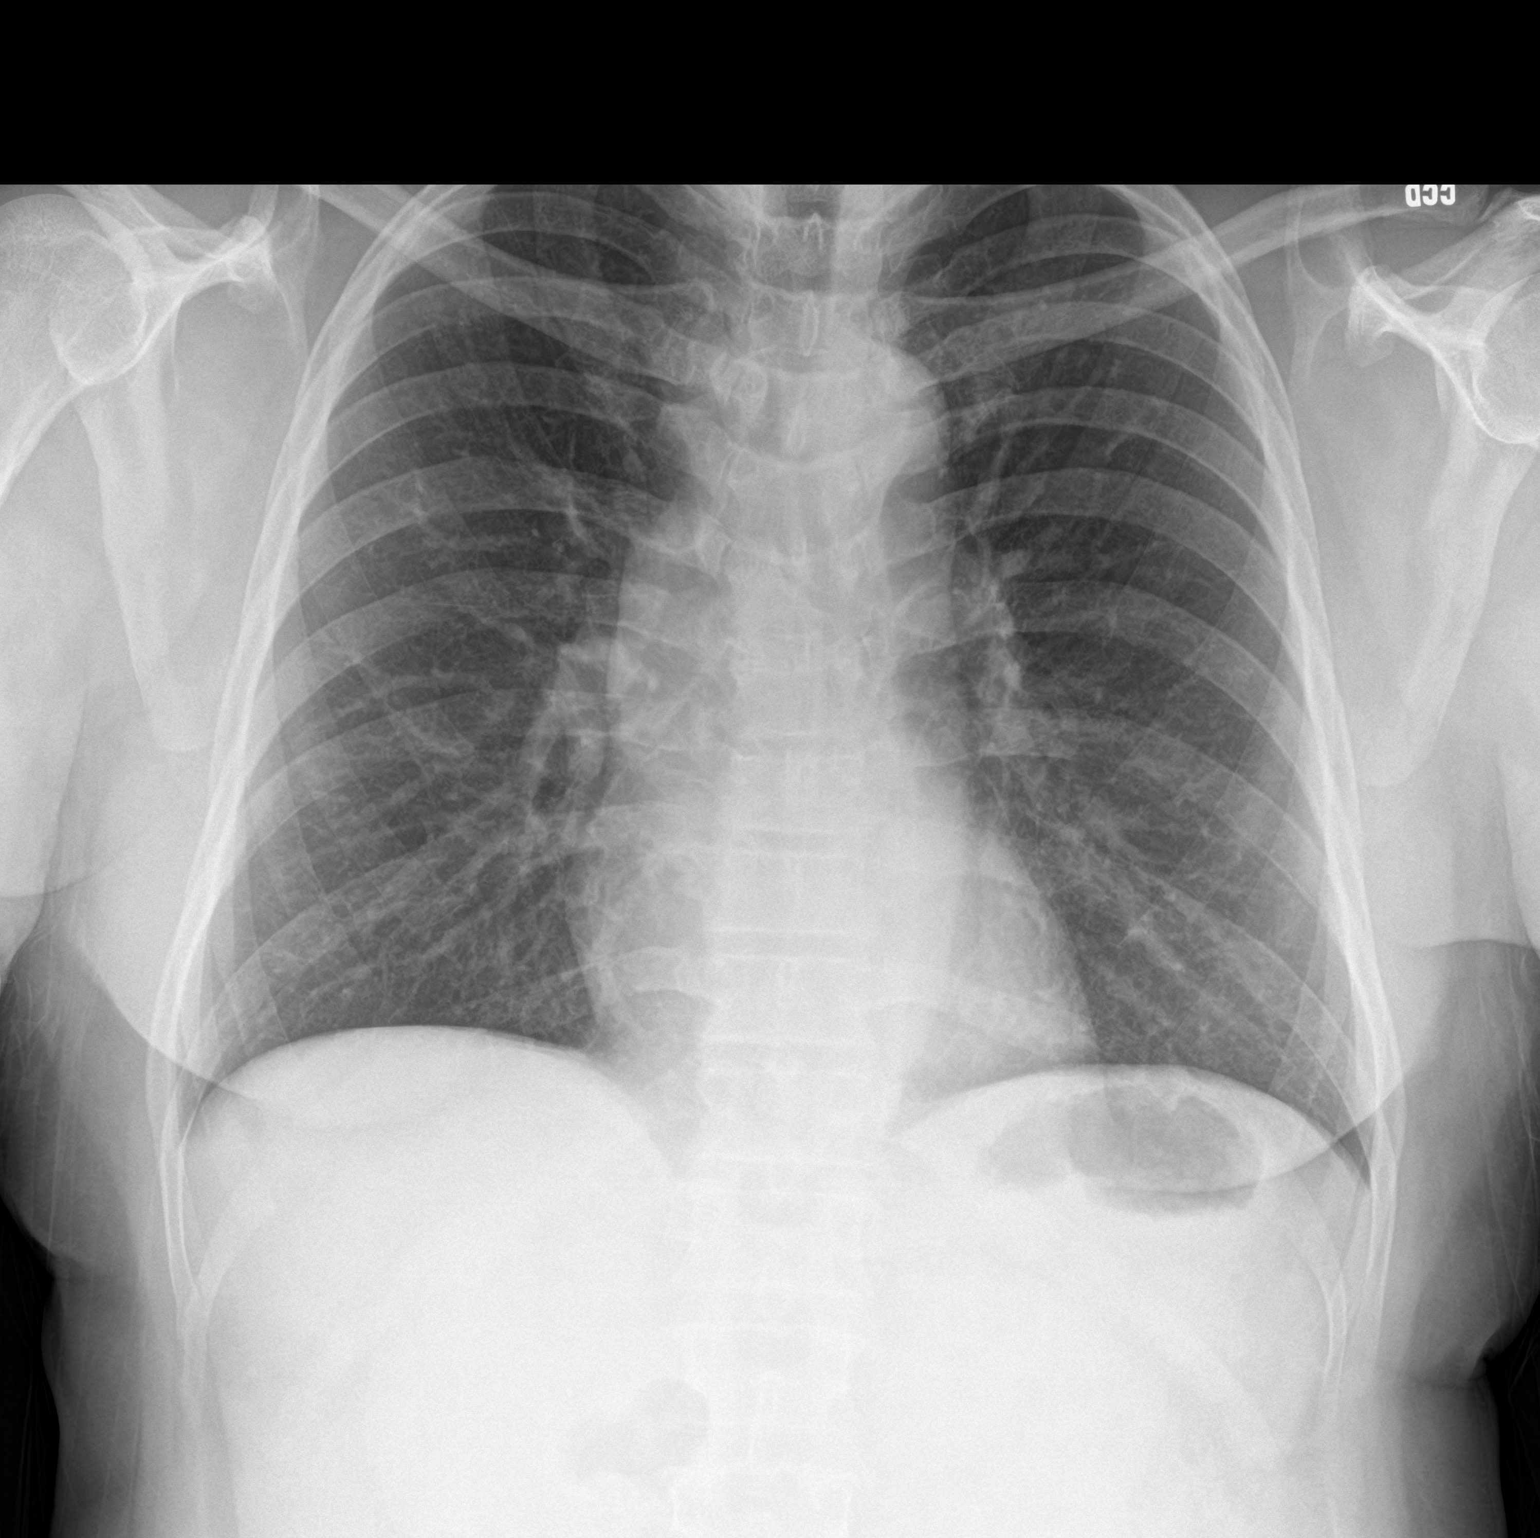

[chest lat]
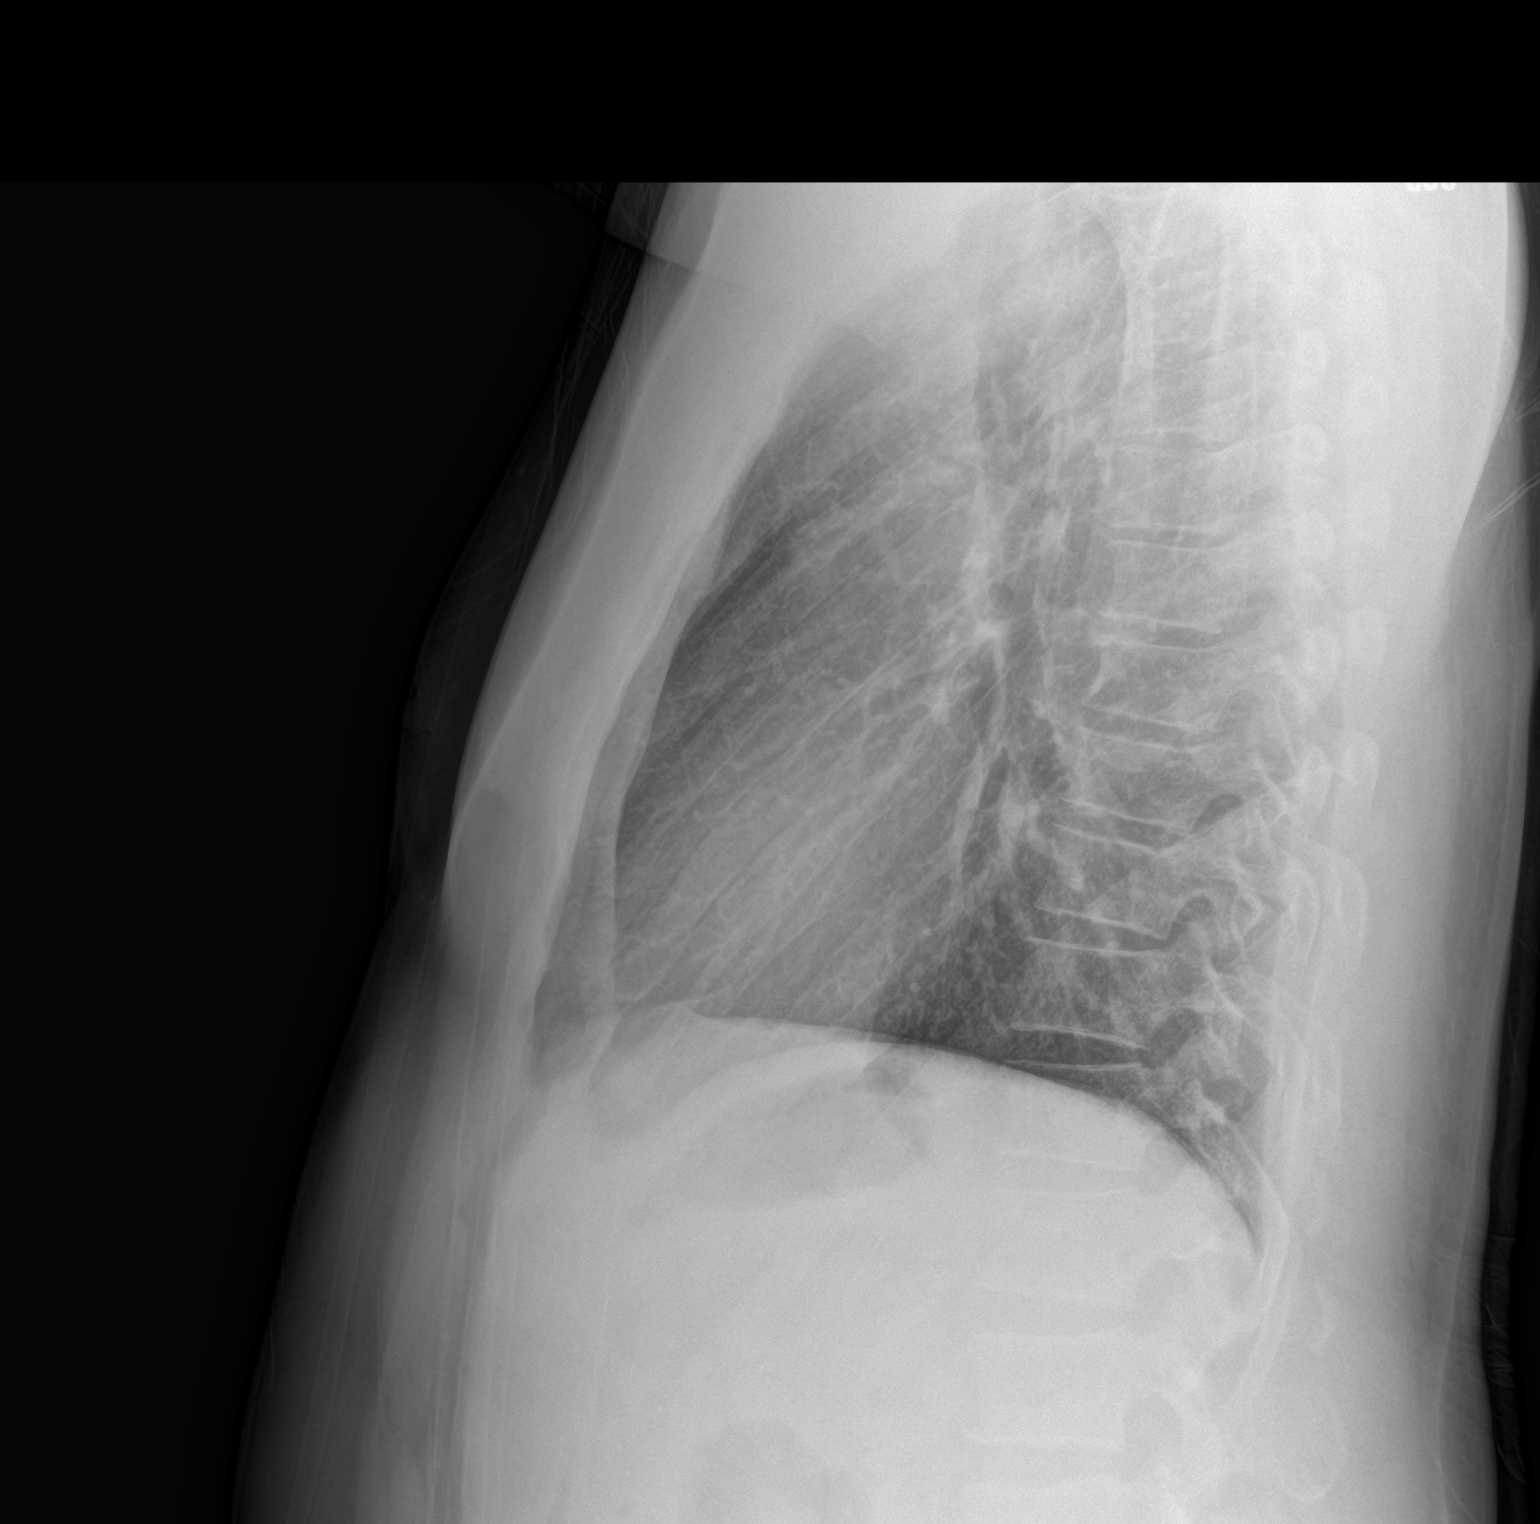

[2 of 2 positions shown; findings below may reference images not displayed]

FINDINGS: The heart size and mediastinal contours are within normal limits.
Both lungs are clear. The visualized skeletal structures are
unremarkable.
IMPRESSION: No active cardiopulmonary disease.

## 2017-02-17 IMAGING — DX DG LUMBAR SPINE 2-3V
2 series · 2 of 2 positions shown · non-contrast
Comparison: 11/04/2015

CLINICAL DATA: Preop

EXAM:
LUMBAR SPINE - 2-3 VIEW

[l-spine ap]
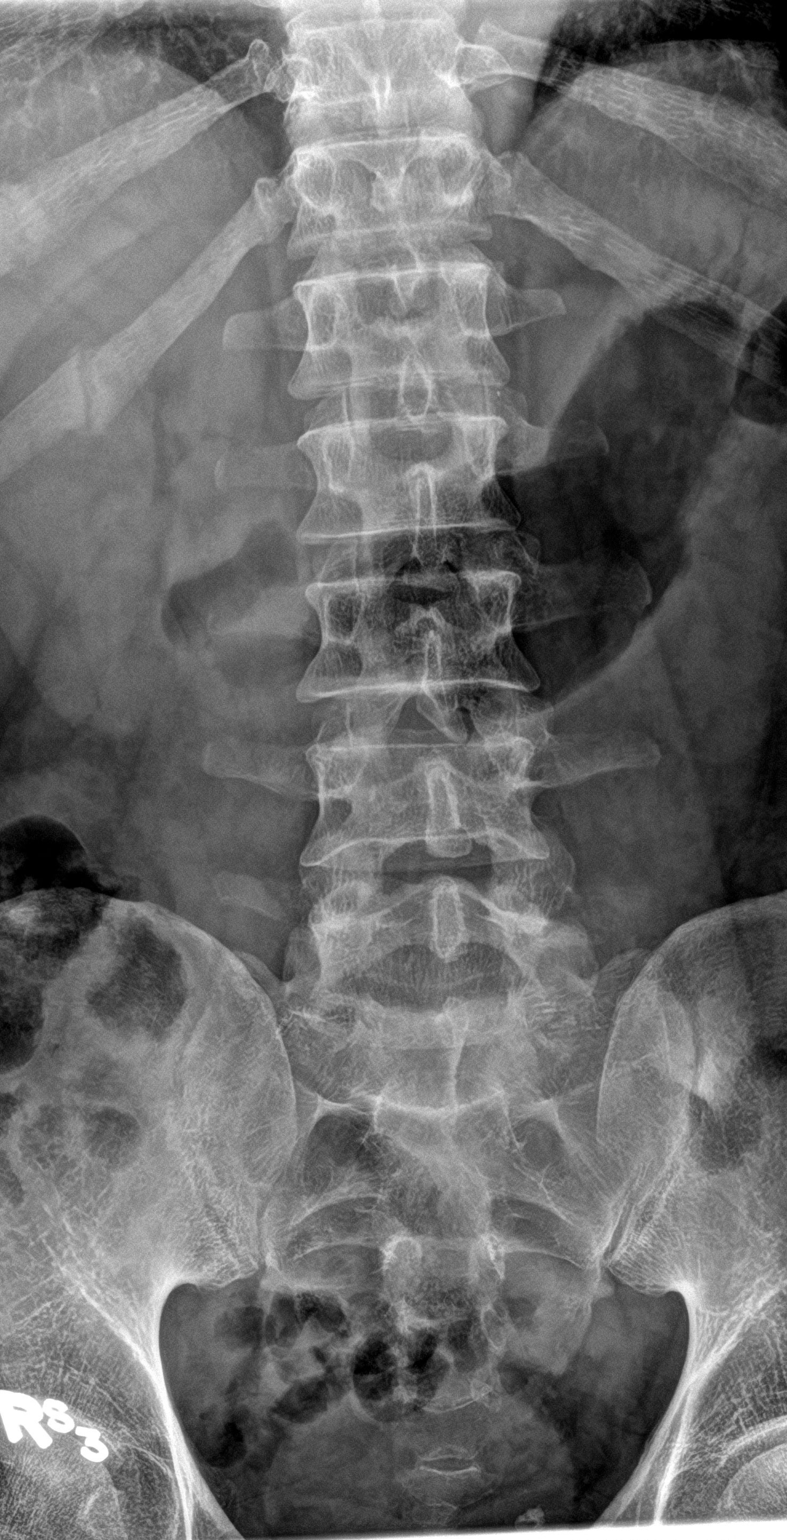

[l-spine lat]
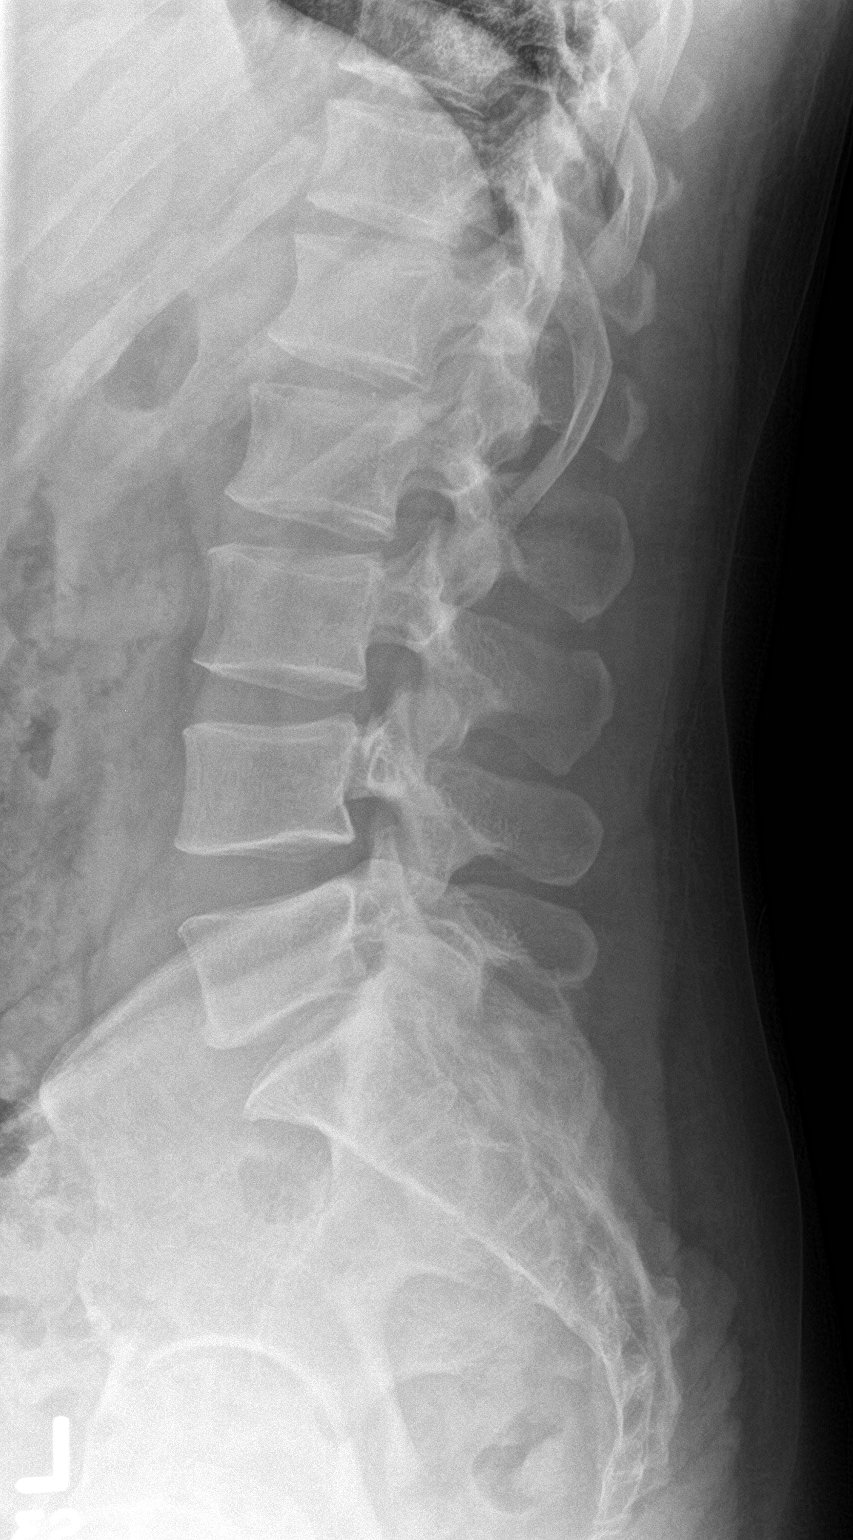

[2 of 2 positions shown; findings below may reference images not displayed]

FINDINGS: Anatomic alignment. No vertebral compression deformity. Mild
narrowing of the L5-S1 disc with mild facet arthropathy. No definite
acute fracture. Healing right twelfth rib fracture is noted.
IMPRESSION: No acute bony pathology.

## 2017-02-17 IMAGING — DX DG SPINE 1V PORT
1 series · 1 of 1 positions shown · non-contrast
Comparison: 02/02/2016

CLINICAL DATA: Intraoperative localization at L5-S1

EXAM:
PORTABLE SPINE - 1 VIEW

[l-spine lat]
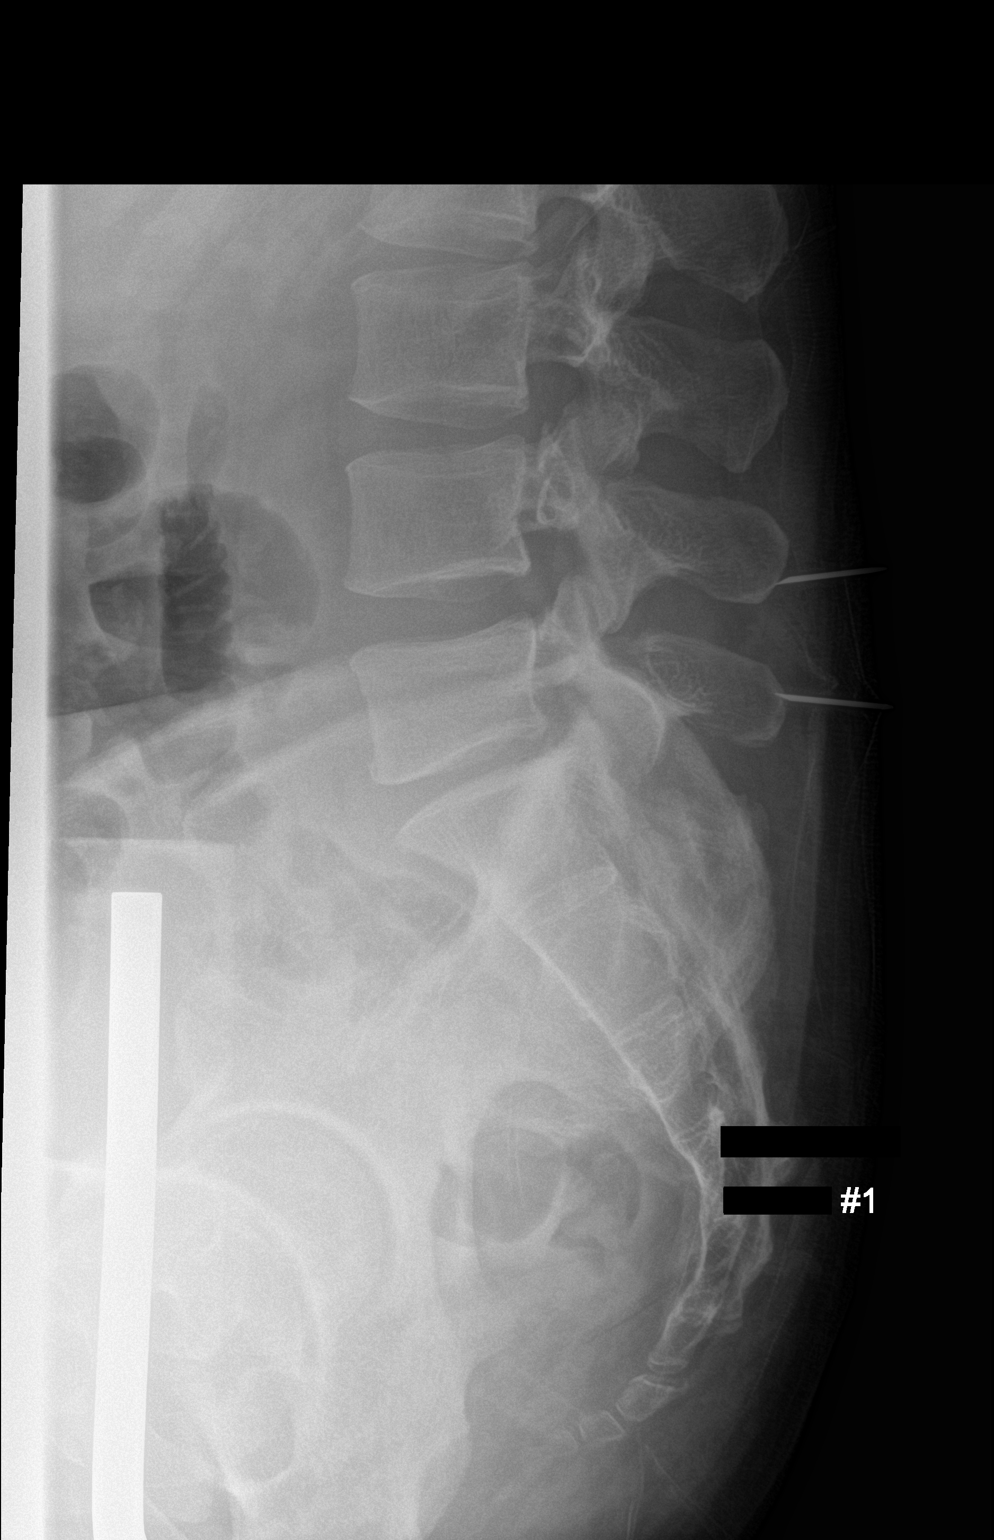

[1 of 1 positions shown; findings below may reference images not displayed]

FINDINGS: Lateral view of the lumbar spine reveals needles in the posterior
soft tissues adjacent to the spinous processes at L4 and L5 at
approximately the interspace level of L4-5 and L5-S1 respectively.
IMPRESSION: Intraoperative lumbar localization

## 2017-02-17 IMAGING — DX DG SPINE 1V PORT
1 series · 1 of 1 positions shown · non-contrast
Comparison: Localizing films earlier today.

CLINICAL DATA: Intraoperative localization film. Patient for L5-S1
decompression.

EXAM:
PORTABLE SPINE - 1 VIEW

[l-spine lat]
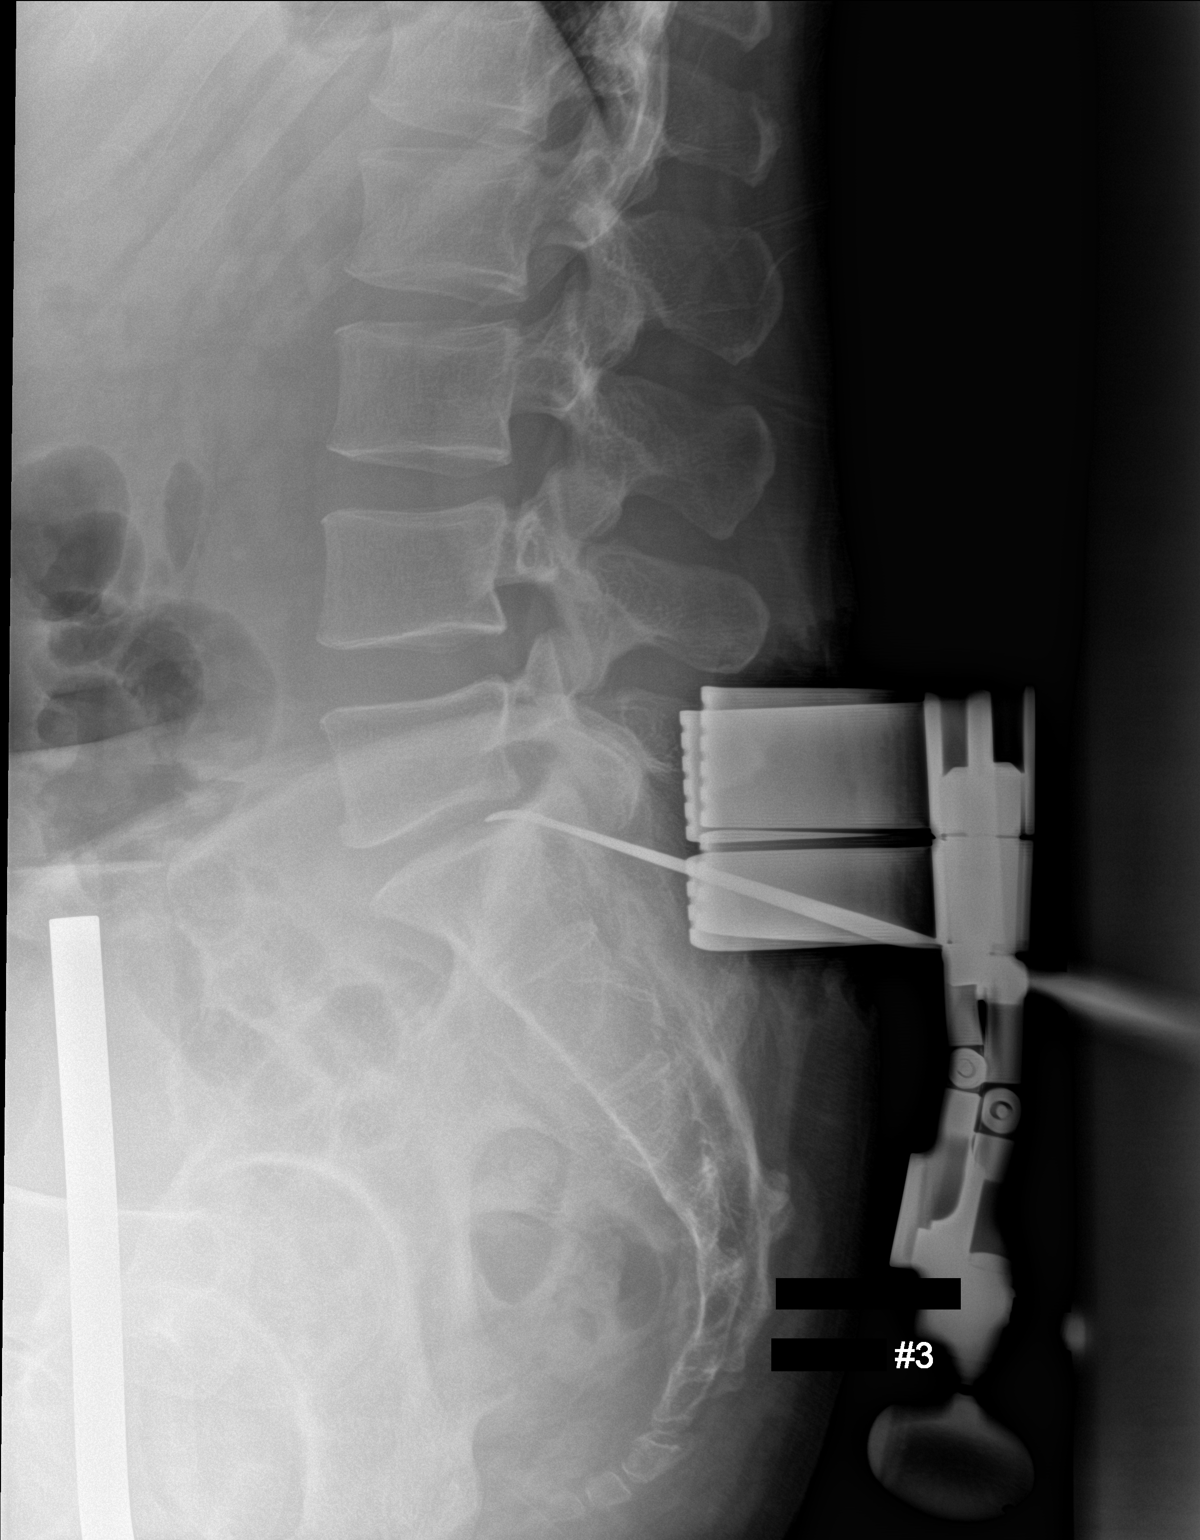

[1 of 1 positions shown; findings below may reference images not displayed]

FINDINGS: Single lateral intraoperative view of the lumbar spine demonstrates
a metallic probe projecting at the L5-S1 disc interspace.
IMPRESSION: Localization as above.

## 2017-04-21 ENCOUNTER — Other Ambulatory Visit: Payer: Self-pay | Admitting: Primary Care

## 2017-04-21 DIAGNOSIS — E785 Hyperlipidemia, unspecified: Secondary | ICD-10-CM

## 2017-04-21 DIAGNOSIS — Z125 Encounter for screening for malignant neoplasm of prostate: Secondary | ICD-10-CM

## 2017-05-04 ENCOUNTER — Other Ambulatory Visit (INDEPENDENT_AMBULATORY_CARE_PROVIDER_SITE_OTHER): Payer: BLUE CROSS/BLUE SHIELD

## 2017-05-04 DIAGNOSIS — E785 Hyperlipidemia, unspecified: Secondary | ICD-10-CM

## 2017-05-04 DIAGNOSIS — Z125 Encounter for screening for malignant neoplasm of prostate: Secondary | ICD-10-CM

## 2017-05-04 LAB — COMPREHENSIVE METABOLIC PANEL
ALBUMIN: 4.4 g/dL (ref 3.5–5.2)
ALT: 22 U/L (ref 0–53)
AST: 15 U/L (ref 0–37)
Alkaline Phosphatase: 58 U/L (ref 39–117)
BUN: 10 mg/dL (ref 6–23)
CHLORIDE: 104 meq/L (ref 96–112)
CO2: 27 meq/L (ref 19–32)
Calcium: 9.5 mg/dL (ref 8.4–10.5)
Creatinine, Ser: 0.92 mg/dL (ref 0.40–1.50)
GFR: 111.91 mL/min (ref >60.00)
GLUCOSE: 101 mg/dL — AB (ref 70–99)
POTASSIUM: 3.9 meq/L (ref 3.5–5.1)
Sodium: 139 mEq/L (ref 135–145)
Total Bilirubin: 0.6 mg/dL (ref 0.2–1.2)
Total Protein: 6.6 g/dL (ref 6.0–8.3)

## 2017-05-04 LAB — LIPID PANEL
CHOLESTEROL: 211 mg/dL — AB (ref 0–200)
HDL: 46 mg/dL (ref >39.00)
LDL CALC: 144 mg/dL — AB (ref 0–99)
NONHDL: 165.02
Total CHOL/HDL Ratio: 5
Triglycerides: 107 mg/dL (ref 0.0–149.0)
VLDL: 21.4 mg/dL (ref 0.0–40.0)

## 2017-05-04 LAB — PSA: PSA: 0.87 ng/mL (ref 0.10–4.00)

## 2017-05-09 ENCOUNTER — Other Ambulatory Visit: Payer: Self-pay

## 2017-05-10 ENCOUNTER — Encounter: Payer: Self-pay | Admitting: Primary Care

## 2017-05-10 ENCOUNTER — Ambulatory Visit (INDEPENDENT_AMBULATORY_CARE_PROVIDER_SITE_OTHER): Payer: BLUE CROSS/BLUE SHIELD | Admitting: Primary Care

## 2017-05-10 ENCOUNTER — Encounter: Payer: Self-pay | Admitting: Gastroenterology

## 2017-05-10 VITALS — BP 124/80 | HR 64 | Temp 97.8°F | Ht 74.0 in | Wt 218.4 lb

## 2017-05-10 DIAGNOSIS — Z0000 Encounter for general adult medical examination without abnormal findings: Secondary | ICD-10-CM

## 2017-05-10 DIAGNOSIS — E785 Hyperlipidemia, unspecified: Secondary | ICD-10-CM | POA: Diagnosis not present

## 2017-05-10 DIAGNOSIS — Z1211 Encounter for screening for malignant neoplasm of colon: Secondary | ICD-10-CM | POA: Diagnosis not present

## 2017-05-10 DIAGNOSIS — F4310 Post-traumatic stress disorder, unspecified: Secondary | ICD-10-CM

## 2017-05-10 NOTE — Assessment & Plan Note (Signed)
Immunizations UTD. Colonoscopy due, pending. Discussed the importance of a healthy diet and regular exercise in order for weight loss, and to reduce the risk of other medical problems. Exam unremarkable. Labs with improved cholesterol, overall stable. Follow up in 1 year for annual exam.

## 2017-05-10 NOTE — Progress Notes (Signed)
Subjective:    Patient ID: Walter Beck, male    DOB: Dec 06, 1965, 51 y.o.   MRN: [id number]  HPI  Walter Beck is a 51 year old male who presents today for complete physical.  Immunizations: -Tetanus: Completed in 2016 -Influenza: Did not complete last season.   Diet: He endorses a fair diet Breakfast: Pork, eggs, toast Lunch: Skips Dinner: Fast food Snacks: Sometimes, cakes Desserts: 1-2 twice weekly Beverages: Juice, soda, no water, alcohol on the weekends  Exercise: He does not currently exercise  Eye exam: Completed in 2018 Dental exam: Completed today. Colonoscopy: Due. Would prefer [place]. PSA: Normal in 2018   Review of Systems  Constitutional: Negative for unexpected weight change.  HENT: Negative for rhinorrhea.   Respiratory: Negative for cough and shortness of breath.   Cardiovascular: Negative for chest pain.  Gastrointestinal: Negative for constipation and diarrhea.  Genitourinary: Negative for difficulty urinating.  Musculoskeletal: Negative for arthralgias and myalgias.  Skin: Negative for rash.  Allergic/Immunologic: Negative for environmental allergies.  Neurological: Negative for dizziness, numbness and headaches.  Psychiatric/Behavioral:       Denies concerns for anxiety or depression       Past Medical History:  Diagnosis Date  . Back pain   . Heart murmur   . PTSD (post-traumatic stress disorder)   . Spinal stenosis      Social History   Social History  . Marital status: Married    Spouse name: N/A  . Number of children: N/A  . Years of education: N/A   Occupational History  . Not on file.   Social History Main Topics  . Smoking status: Former Games developermoker  . Smokeless tobacco: Former NeurosurgeonUser    Quit date: 01/26/2003  . Alcohol use Yes     Comment: "sometimes"  . Drug use: No  . Sexual activity: Not on file   Other Topics Concern  . Not on file   Social History Narrative   Married.   3 boys, 4 grandchildren.   Works as  a Midwifesignal maintainer.    Enjoys fishing and family.    Past Surgical History:  Procedure Laterality Date  . LUMBAR LAMINECTOMY/DECOMPRESSION MICRODISCECTOMY N/A 02/02/2016   Procedure: LUMBAR LAMINECTOMY/DECOMPRESSION MICRODISCECTOMY 1 LEVEL L5-S1 RIGHT ;  Surgeon: Ranee Gosselinonald Gioffre, MD;  Location: [facility];  Service: Orthopedics;  Laterality: N/A;  . TENDON REPAIR  1996   right forearm    No family history on file.  No Known Allergies  No current outpatient prescriptions on file prior to visit.   No current facility-administered medications on file prior to visit.     BP 124/80   Pulse 64   Temp 97.8 F (36.6 C) (Oral)   Ht 6\' 2"  (1.88 m)   Wt 218 lb 6.4 oz (99.1 kg)   SpO2 99%   BMI 28.04 kg/m    Objective:   Physical Exam  Constitutional: He is oriented to person, place, and time. He appears well-nourished.  HENT:  Right Ear: Tympanic membrane and ear canal normal.  Left Ear: Tympanic membrane and ear canal normal.  Nose: Nose normal. Right sinus exhibits no maxillary sinus tenderness and no frontal sinus tenderness. Left sinus exhibits no maxillary sinus tenderness and no frontal sinus tenderness.  Mouth/Throat: Oropharynx is clear and moist.  Eyes: Conjunctivae and EOM are normal. Pupils are equal, round, and reactive to light.  Neck: Neck supple. Carotid bruit is not present. No thyromegaly present.  Cardiovascular: Normal rate, regular rhythm and normal heart sounds.  Pulmonary/Chest: Effort normal and breath sounds normal. He has no wheezes. He has no rales.  Abdominal: Soft. Bowel sounds are normal. There is no tenderness.  Musculoskeletal: Normal range of motion.  Neurological: He is alert and oriented to person, place, and time. He has normal reflexes. No cranial nerve deficit.  Skin: Skin is warm and dry.  Psychiatric: He has a normal mood and affect.          Assessment & Plan:

## 2017-05-10 NOTE — Assessment & Plan Note (Signed)
Improved compared to December 2017. Will have him work on diet and exercise, repeat lipids in 6 months.

## 2017-05-10 NOTE — Assessment & Plan Note (Signed)
Overall doing well, no concerns for anxiety

## 2017-05-10 NOTE — Patient Instructions (Signed)
It's important to improve your diet by reducing consumption of fast food, fried food, processed snack foods, sugary drinks. Increase consumption of fresh vegetables and fruits, whole grains, water.  Ensure you are drinking 64 ounces of water daily.  Start exercising. You should be getting 150 minutes of moderate intensity exercise weekly.  You will be contacted regarding your referral to GI for the colonoscopy.  Please let us know if you have not heard back within one week.   Schedule a lab only appointment in 6 months to recheck your cholesterol and blood sugars.  Follow up in 1 year for your annual exam.  It was a pleasure to see you today!

## 2017-05-12 ENCOUNTER — Encounter: Payer: Self-pay | Admitting: Primary Care

## 2017-06-29 ENCOUNTER — Ambulatory Visit (AMBULATORY_SURGERY_CENTER): Payer: Self-pay | Admitting: *Deleted

## 2017-06-29 VITALS — Ht 74.0 in | Wt 222.0 lb

## 2017-06-29 DIAGNOSIS — Z1211 Encounter for screening for malignant neoplasm of colon: Secondary | ICD-10-CM

## 2017-06-29 MED ORDER — NA SULFATE-K SULFATE-MG SULF 17.5-3.13-1.6 GM/177ML PO SOLN: 1.0000 | Freq: Once | ORAL | 0 refills | Status: AC

## 2017-06-29 NOTE — Progress Notes (Signed)
No egg or soy allergy known to patient  No issues with past sedation with any surgeries  or procedures, no intubation problems  No diet pills per patient No home 02 use per patient  No blood thinners per patient  Pt denies issues with constipation  No A fib or A flutter  EMMI video sent to pt's e mail  

## 2017-07-05 ENCOUNTER — Encounter: Payer: Self-pay | Admitting: Gastroenterology

## 2017-07-11 ENCOUNTER — Encounter: Payer: Self-pay | Admitting: Gastroenterology

## 2017-07-11 ENCOUNTER — Ambulatory Visit (AMBULATORY_SURGERY_CENTER): Payer: BLUE CROSS/BLUE SHIELD | Admitting: Gastroenterology

## 2017-07-11 VITALS — BP 128/77 | HR 55 | Temp 97.5°F | Resp 23 | Ht 74.0 in | Wt 222.0 lb

## 2017-07-11 DIAGNOSIS — Z1211 Encounter for screening for malignant neoplasm of colon: Secondary | ICD-10-CM | POA: Diagnosis not present

## 2017-07-11 DIAGNOSIS — Z1212 Encounter for screening for malignant neoplasm of rectum: Secondary | ICD-10-CM | POA: Diagnosis not present

## 2017-07-11 MED ORDER — SODIUM CHLORIDE 0.9 % IV SOLN: 500.0000 mL | INTRAVENOUS | Status: AC

## 2017-07-11 NOTE — Op Note (Signed)
[facility] Patient Name: Walter Beck Procedure Date: 07/11/2017 7:53 AM MRN: [id number] Endoscopist: Viviann Spare P. Armbruster MD, MD Age: 51 Referring MD:  Date of Birth: 1966-09-13 Gender: Male Account #: [id number] Procedure:                Colonoscopy Indications:              Screening for colorectal malignant neoplasm, This                            is the patient's first colonoscopy Medicines:                Monitored Anesthesia Care Procedure:                Pre-Anesthesia Assessment:                           - Prior to the procedure, a History and Physical                            was performed, and patient medications and                            allergies were reviewed. The patient's tolerance of                            previous anesthesia was also reviewed. The risks                            and benefits of the procedure and the sedation                            options and risks were discussed with the patient.                            All questions were answered, and informed consent                            was obtained. Prior Anticoagulants: The patient has                            taken no previous anticoagulant or antiplatelet                            agents. ASA Grade Assessment: II - A patient with                            mild systemic disease. After reviewing the risks                            and benefits, the patient was deemed in                            satisfactory condition to undergo the procedure.  After obtaining informed consent, the colonoscope                            was passed under direct vision. Throughout the                            procedure, the patient's blood pressure, pulse, and                            oxygen saturations were monitored continuously. The                            Colonoscope was introduced through the anus and                            advanced to the  the cecum, identified by                            appendiceal orifice and ileocecal valve. The                            colonoscopy was performed without difficulty. The                            patient tolerated the procedure well. The quality                            of the bowel preparation was good. The ileocecal                            valve, appendiceal orifice, and rectum were                            photographed. Scope In: 7:59:56 AM Scope Out: 8:14:58 AM Scope Withdrawal Time: 0 hours 12 minutes 3 seconds  Total Procedure Duration: 0 hours 15 minutes 2 seconds  Findings:                 The perianal and digital rectal examinations were                            normal.                           A few small-mouthed diverticula were found in the                            sigmoid colon.                           The exam was otherwise without abnormality. No                            polyps Complications:            No immediate complications. Estimated blood loss:  None. Estimated Blood Loss:     Estimated blood loss: none. Impression:               - Diverticulosis in the sigmoid colon.                           - The examination was otherwise normal.                           - No specimens collected. Recommendation:           - Patient has a contact number available for                            emergencies. The signs and symptoms of potential                            delayed complications were discussed with the                            patient. Return to normal activities tomorrow.                            Written discharge instructions were provided to the                            patient.                           - Resume previous diet.                           - Continue present medications.                           - Repeat colonoscopy in 10 years for screening                            purposes. Viviann Spare P.  Armbruster MD, MD 07/11/2017 8:17:45 AM This report has been signed electronically.

## 2017-07-11 NOTE — Patient Instructions (Signed)
YOU HAD AN ENDOSCOPIC PROCEDURE TODAY AT THE [facility]:   Refer to the procedure report that was given to you for any specific questions about what was found during the examination.  If the procedure report does not answer your questions, please call your gastroenterologist to clarify.  If you requested that your care partner not be given the details of your procedure findings, then the procedure report has been included in a sealed envelope for you to review at your convenience later.  YOU SHOULD EXPECT: Some feelings of bloating in the abdomen. Passage of more gas than usual.  Walking can help get rid of the air that was put into your GI tract during the procedure and reduce the bloating. If you had a lower endoscopy (such as a colonoscopy or flexible sigmoidoscopy) you may notice spotting of blood in your stool or on the toilet paper. If you underwent a bowel prep for your procedure, you may not have a normal bowel movement for a few days.  Please Note:  You might notice some irritation and congestion in your nose or some drainage.  This is from the oxygen used during your procedure.  There is no need for concern and it should clear up in a day or so.  SYMPTOMS TO REPORT IMMEDIATELY:   Following lower endoscopy (colonoscopy or flexible sigmoidoscopy):  Excessive amounts of blood in the stool  Significant tenderness or worsening of abdominal pains  Swelling of the abdomen that is new, acute  Fever of 100F or higher   For urgent or emergent issues, a gastroenterologist can be reached at any hour by calling [phone].   DIET:  We do recommend a small meal at first, but then you may proceed to your regular diet.  Drink plenty of fluids but you should avoid alcoholic beverages for 24 hours.  ACTIVITY:  You should plan to take it easy for the rest of today and you should NOT DRIVE or use heavy machinery until tomorrow (because of the sedation medicines used during the test).     FOLLOW UP: Our staff will call the number listed on your records the next business day following your procedure to check on you and address any questions or concerns that you may have regarding the information given to you following your procedure. If we do not reach you, we will leave a message.  However, if you are feeling well and you are not experiencing any problems, there is no need to return our call.  We will assume that you have returned to your regular daily activities without incident.  If any biopsies were taken you will be contacted by phone or by letter within the next 1-3 weeks.  Please call us at [phone] if you have not heard about the biopsies in 3 weeks.    SIGNATURES/CONFIDENTIALITY: You and/or your care partner have signed paperwork which will be entered into your electronic medical record.  These signatures attest to the fact that that the information above on your After Visit Summary has been reviewed and is understood.  Full responsibility of the confidentiality of this discharge information lies with you and/or your care-partner.  Read all of the instructions given to you by your recovery room nurse.

## 2017-07-11 NOTE — Progress Notes (Signed)
Spontaneous respirations throughout. VSS. Resting comfortably. To PACU on room air. Report to  RN. 

## 2017-07-11 NOTE — Progress Notes (Signed)
Pt's states no medical or surgical changes since previsit or office visit. 

## 2017-07-12 ENCOUNTER — Telehealth: Payer: Self-pay

## 2017-07-12 NOTE — Telephone Encounter (Signed)
  Follow up Call-  Call back number 07/11/2017  Post procedure Call Back phone  # [phone]  Permission to leave phone message Yes  Some recent data might be hidden     Patient questions:  Do you have a fever, pain , or abdominal swelling? No. Pain Score  0 *  Have you tolerated food without any problems? Yes.    Have you been able to return to your normal activities? Yes.    Do you have any questions about your discharge instructions: Diet   No. Medications  No. Follow up visit  No.  Do you have questions or concerns about your Care? No.  Actions: * If pain score is 4 or above: No action needed, pain <4.  

## 2017-07-12 NOTE — Telephone Encounter (Signed)
  Follow up Call-  Call back number 07/11/2017  Post procedure Call Back phone  # [phone]  Permission to leave phone message Yes  Some recent data might be hidden     Patient questions:  Do you have a fever, pain , or abdominal swelling? No. Pain Score  0 *  Have you tolerated food without any problems? Yes.    Have you been able to return to your normal activities? Yes.    Do you have any questions about your discharge instructions: Diet   No. Medications  No. Follow up visit  No.  Do you have questions or concerns about your Care? No.  Actions: * If pain score is 4 or above: No action needed, pain <4.

## 2017-07-14 ENCOUNTER — Ambulatory Visit: Payer: BLUE CROSS/BLUE SHIELD | Admitting: Primary Care

## 2017-11-01 ENCOUNTER — Other Ambulatory Visit: Payer: Self-pay | Admitting: Primary Care

## 2017-11-01 DIAGNOSIS — E785 Hyperlipidemia, unspecified: Secondary | ICD-10-CM

## 2017-11-06 ENCOUNTER — Other Ambulatory Visit: Payer: Self-pay

## 2017-11-15 DIAGNOSIS — Z79899 Other long term (current) drug therapy: Secondary | ICD-10-CM | POA: Diagnosis not present

## 2017-11-15 DIAGNOSIS — M79604 Pain in right leg: Secondary | ICD-10-CM | POA: Diagnosis not present

## 2017-11-15 DIAGNOSIS — M25512 Pain in left shoulder: Secondary | ICD-10-CM | POA: Diagnosis not present

## 2017-11-15 DIAGNOSIS — G8929 Other chronic pain: Secondary | ICD-10-CM | POA: Diagnosis not present

## 2017-11-15 DIAGNOSIS — Z5181 Encounter for therapeutic drug level monitoring: Secondary | ICD-10-CM | POA: Diagnosis not present

## 2017-11-15 DIAGNOSIS — M545 Low back pain: Secondary | ICD-10-CM | POA: Diagnosis not present

## 2018-05-14 ENCOUNTER — Encounter: Payer: Self-pay | Admitting: Primary Care

## 2018-05-23 ENCOUNTER — Encounter: Payer: BLUE CROSS/BLUE SHIELD | Admitting: Primary Care

## 2018-05-24 DIAGNOSIS — Z0289 Encounter for other administrative examinations: Secondary | ICD-10-CM

## 2018-09-06 DIAGNOSIS — S92351A Displaced fracture of fifth metatarsal bone, right foot, initial encounter for closed fracture: Secondary | ICD-10-CM | POA: Diagnosis not present

## 2018-09-06 DIAGNOSIS — M79671 Pain in right foot: Secondary | ICD-10-CM | POA: Diagnosis not present

## 2018-09-14 DIAGNOSIS — S92354A Nondisplaced fracture of fifth metatarsal bone, right foot, initial encounter for closed fracture: Secondary | ICD-10-CM | POA: Diagnosis not present

## 2018-09-28 DIAGNOSIS — S92354A Nondisplaced fracture of fifth metatarsal bone, right foot, initial encounter for closed fracture: Secondary | ICD-10-CM | POA: Diagnosis not present

## 2019-08-28 DIAGNOSIS — G4700 Insomnia, unspecified: Secondary | ICD-10-CM | POA: Diagnosis not present

## 2019-08-28 DIAGNOSIS — F4310 Post-traumatic stress disorder, unspecified: Secondary | ICD-10-CM | POA: Diagnosis not present

## 2019-08-28 DIAGNOSIS — F329 Major depressive disorder, single episode, unspecified: Secondary | ICD-10-CM | POA: Diagnosis not present

## 2023-03-28 ENCOUNTER — Ambulatory Visit
Admission: EM | Admit: 2023-03-28 | Discharge: 2023-03-28 | Disposition: A | Discharge status: Home / Self Care | Payer: BLUE CROSS/BLUE SHIELD

## 2023-03-28 ENCOUNTER — Ambulatory Visit
Admission: EM | Admit: 2023-03-28 | Discharge: 2023-03-28 | Disposition: A | Discharge status: Home / Self Care | Payer: Self-pay | Attending: Nurse Practitioner | Admitting: Nurse Practitioner

## 2023-03-28 DIAGNOSIS — W57XXXA Bitten or stung by nonvenomous insect and other nonvenomous arthropods, initial encounter: Secondary | ICD-10-CM

## 2023-03-28 DIAGNOSIS — S80262A Insect bite (nonvenomous), left knee, initial encounter: Secondary | ICD-10-CM

## 2023-03-28 MED ORDER — DOXYCYCLINE HYCLATE 100 MG PO CAPS: 100.0000 mg | ORAL_CAPSULE | Freq: Two times a day (BID) | ORAL | 0 refills | Status: AC

## 2023-03-28 NOTE — ED Triage Notes (Addendum)
Pt c/o tick bites located in the bend of the left knee and left shoulder pt staes he has noticed a rash in both spots. Pt noticed the bites x 2 weeks ago. Itchiness to the site of the bites

## 2023-03-28 NOTE — Discharge Instructions (Signed)
We will contact you if the blood testing comes back positive.  In the meantime, start the doxycycline to treat Lyme Disease/RMSF.  Seek care if you develop high fevers, severe joint pain, or headache that does not improve.
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
## 2023-04-03 LAB — SPOTTED FEVER GROUP ANTIBODIES
Spotted Fever Group IgG: <1:64 {titer}
Spotted Fever Group IgM: <1:64 {titer}

## 2023-04-03 LAB — LYME DISEASE SEROLOGY W/REFLEX: Lyme Total Antibody EIA: NEGATIVE
